# Patient Record
Sex: Female | Born: 1980 | Race: White | Hispanic: No | Marital: Single | Smoking: Never smoker
Health system: Southern US, Community
[De-identification: ages and names within clinical notes are randomized; demographics above are authoritative.]

## PROBLEM LIST (undated history)

## (undated) DIAGNOSIS — G4700 Insomnia, unspecified: Secondary | ICD-10-CM

## (undated) DIAGNOSIS — Z982 Presence of cerebrospinal fluid drainage device: Secondary | ICD-10-CM

## (undated) DIAGNOSIS — R002 Palpitations: Secondary | ICD-10-CM

## (undated) DIAGNOSIS — I10 Essential (primary) hypertension: Secondary | ICD-10-CM

## (undated) DIAGNOSIS — G919 Hydrocephalus, unspecified: Secondary | ICD-10-CM

## (undated) DIAGNOSIS — F329 Major depressive disorder, single episode, unspecified: Secondary | ICD-10-CM

## (undated) DIAGNOSIS — R931 Abnormal findings on diagnostic imaging of heart and coronary circulation: Secondary | ICD-10-CM

## (undated) DIAGNOSIS — G809 Cerebral palsy, unspecified: Secondary | ICD-10-CM

## (undated) DIAGNOSIS — F411 Generalized anxiety disorder: Secondary | ICD-10-CM

## (undated) DIAGNOSIS — N393 Stress incontinence (female) (male): Secondary | ICD-10-CM

## (undated) HISTORY — DX: Abnormal findings on diagnostic imaging of heart and coronary circulation: R93.1

## (undated) HISTORY — DX: Stress incontinence (female) (male): N39.3

## (undated) HISTORY — DX: Generalized anxiety disorder: F41.1

## (undated) HISTORY — DX: Insomnia, unspecified: G47.00

## (undated) HISTORY — DX: Palpitations: R00.2

## (undated) HISTORY — PX: BRAIN SURGERY: SHX531

## (undated) HISTORY — PX: OTHER SURGICAL HISTORY: SHX169

## (undated) HISTORY — PX: REDUCTION MAMMAPLASTY: SUR839

## (undated) HISTORY — DX: Major depressive disorder, single episode, unspecified: F32.9

---
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
## 2000-07-18 ENCOUNTER — Ambulatory Visit (HOSPITAL_COMMUNITY): Admission: RE | Admit: 2000-07-18 | Discharge: 2000-07-18 | Payer: Self-pay | Admitting: Pediatrics

## 2000-07-18 ENCOUNTER — Encounter: Payer: Self-pay | Admitting: Pediatrics

## 2000-08-04 ENCOUNTER — Ambulatory Visit (HOSPITAL_COMMUNITY): Admission: RE | Admit: 2000-08-04 | Discharge: 2000-08-04 | Payer: Self-pay | Admitting: Neurology

## 2001-01-12 ENCOUNTER — Other Ambulatory Visit: Admission: RE | Admit: 2001-01-12 | Discharge: 2001-01-12 | Payer: Self-pay | Admitting: *Deleted

## 2002-09-10 ENCOUNTER — Other Ambulatory Visit: Admission: RE | Admit: 2002-09-10 | Discharge: 2002-09-10 | Payer: Self-pay | Admitting: Obstetrics and Gynecology

## 2003-06-25 ENCOUNTER — Emergency Department (HOSPITAL_COMMUNITY): Admission: EM | Admit: 2003-06-25 | Discharge: 2003-06-25 | Payer: Self-pay

## 2003-09-12 ENCOUNTER — Other Ambulatory Visit: Admission: RE | Admit: 2003-09-12 | Discharge: 2003-09-12 | Payer: Self-pay | Admitting: Obstetrics and Gynecology

## 2004-09-13 ENCOUNTER — Other Ambulatory Visit: Admission: RE | Admit: 2004-09-13 | Discharge: 2004-09-13 | Payer: Self-pay | Admitting: Obstetrics and Gynecology

## 2004-11-14 ENCOUNTER — Emergency Department (HOSPITAL_COMMUNITY): Admission: EM | Admit: 2004-11-14 | Discharge: 2004-11-14 | Payer: Self-pay | Admitting: Emergency Medicine

## 2004-12-06 ENCOUNTER — Encounter: Admission: RE | Admit: 2004-12-06 | Discharge: 2005-01-07 | Payer: Self-pay | Admitting: Orthopedic Surgery

## 2005-09-14 ENCOUNTER — Other Ambulatory Visit: Admission: RE | Admit: 2005-09-14 | Discharge: 2005-09-14 | Payer: Self-pay | Admitting: Obstetrics and Gynecology

## 2006-10-06 ENCOUNTER — Encounter: Admission: RE | Admit: 2006-10-06 | Discharge: 2006-12-07 | Payer: Self-pay | Admitting: Neurology

## 2009-07-20 ENCOUNTER — Encounter: Admission: RE | Admit: 2009-07-20 | Discharge: 2009-10-18 | Payer: Self-pay | Admitting: Neurology

## 2010-06-18 NOTE — Procedures (Signed)
. [facility]  Patient:    Sophia Marks, Sophia Marks                         MRN: [id number] Proc. Date: 08/04/00 Adm. Date:  [id number] Attending:  Fenton Malling                           Procedure Report  DATE OF BIRTH:  1980/02/21  PROCEDURE PERFORMED:  Diagnostic lumbar puncture.  INDICATIONS:  Rule out multiple sclerosis.  PHYSICIAN:  Kelli Hope, M.D.  DESCRIPTION OF PROCEDURE:  The informed consent form was signed and placed on the chart after the procedure risks and benefits were discussed with the patient and her mother and they agreed to proceed.  The patient was placed in the right lateral decubitus position, prepped, and draped in the usual sterile fashion.  Local anesthesia was achieved with 5 cc of lidocaine.  A 20 gauge spinal needle was inserted into the L3-4 interspace and after several passes, clear CSF was obtained.  The opening pressure was measured at 130 mm of water. Approximately 8 cc of CSF were obtained and sent for the following studies. Tube #1 2 cc cell count differential, tube #2 2 cc glucose, protein, oligoclonal bands, immunoelectrophoresis, tube #3 2 cc Grams stain and culture, tube #4 2 cc hold.  Closing pressure was measured at 90 mm of water. The needle was withdrawn and hemostasis was obtained.  No complications were noted.  The patient was advised to remain flat during the procedure for one after the procedure and then will be discharged home.  She was advised to call if she developed worsening symptoms such as fevers, stiff neck or severe postural headache. DD:  08/04/00 TD:  08/04/00 Job: [id number]

## 2011-11-13 ENCOUNTER — Emergency Department (HOSPITAL_COMMUNITY): Payer: Medicaid Other

## 2011-11-13 ENCOUNTER — Encounter (HOSPITAL_COMMUNITY): Payer: Self-pay | Admitting: Emergency Medicine

## 2011-11-13 ENCOUNTER — Emergency Department (HOSPITAL_COMMUNITY)
Admission: EM | Admit: 2011-11-13 | Discharge: 2011-11-13 | Disposition: A | Discharge status: Home / Self Care | Payer: Medicaid Other | Source: Home / Self Care | Attending: Emergency Medicine | Admitting: Emergency Medicine

## 2011-11-13 DIAGNOSIS — R51 Headache: Secondary | ICD-10-CM

## 2011-11-13 DIAGNOSIS — G919 Hydrocephalus, unspecified: Secondary | ICD-10-CM

## 2011-11-13 DIAGNOSIS — Z982 Presence of cerebrospinal fluid drainage device: Secondary | ICD-10-CM | POA: Insufficient documentation

## 2011-11-13 DIAGNOSIS — G809 Cerebral palsy, unspecified: Secondary | ICD-10-CM | POA: Insufficient documentation

## 2011-11-13 DIAGNOSIS — G911 Obstructive hydrocephalus: Secondary | ICD-10-CM | POA: Insufficient documentation

## 2011-11-13 HISTORY — DX: Hydrocephalus, unspecified: G91.9

## 2011-11-13 HISTORY — DX: Presence of cerebrospinal fluid drainage device: Z98.2

## 2011-11-13 HISTORY — DX: Cerebral palsy, unspecified: G80.9

## 2011-11-13 MED ORDER — SODIUM CHLORIDE 0.9 % IV BOLUS (SEPSIS)
1000.0000 mL | Freq: Once | INTRAVENOUS | Status: AC
  Administered 2011-11-13: 1000 mL via INTRAVENOUS

## 2011-11-13 MED ORDER — METOCLOPRAMIDE HCL 5 MG/ML IJ SOLN
10.0000 mg | Freq: Once | INTRAMUSCULAR | Status: AC
  Administered 2011-11-13: 10 mg via INTRAVENOUS
  Filled 2011-11-13: qty 2

## 2011-11-13 MED ORDER — DIPHENHYDRAMINE HCL 50 MG/ML IJ SOLN
25.0000 mg | Freq: Once | INTRAMUSCULAR | Status: AC
  Administered 2011-11-13: 25 mg via INTRAVENOUS
  Filled 2011-11-13: qty 1

## 2011-11-13 NOTE — ED Notes (Signed)
Dr. Venetia Maxon stated pt will go to OR on Wednesday.

## 2011-11-13 NOTE — ED Notes (Signed)
Discharge instructions reviewed w/ pt and mother, both verbalize understanding. To call Dr. Fredrich Birks office in a.m. At [phone] and speak to [facility]. Two prescriptions provided at discharge.

## 2011-11-13 NOTE — ED Notes (Signed)
Dr. Venetia Maxon at bedside to speak with patient and family.

## 2011-11-13 NOTE — Consult Note (Signed)
History    CSN: [id number]  Arrival date & time 11/13/11 1011  First MD Initiated Contact with Patient 11/13/11 1044  Chief Complaint   Patient presents with   .  Headache   .  vp shunt   Patient has shunt-dependent hydrocephalus.  Her last shunt revision was 20 years ago at [facility] and she did well ever since that.  She had 12 total shunt revisions prior to that.  Her previous neurosurgeon is deceased.  Kashay says that she has been having headaches without nausea or vomiting for the last 5 days.  She initially thought these were from stopping coffee, but upon resuming caffeine the headaches have persisted.  A workup in the ER at [facility] today shows ventriculomegaly with two intracranial shunt catheters from a left occipital entry and obvious disruption of shunt tubing at the upper aspect of the distal catheter in the upper neck.  There is the suggestion of trans-ependymal fluid on the Head CT. 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 She has left sided weakness affecting her leg greater than arm as a result of Cerebral Palsy.   Past Medical History   Diagnosis  Date   .  Hydrocephalus    .  VP (ventriculoperitoneal) shunt status    .  Cerebral palsy     Past Surgical History   Procedure  Date   .  Heal surgery     No family history on file.  History   Substance Use Topics   .  Smoking status:  Never Smoker   .  Smokeless tobacco:  Not on file   .  Alcohol Use:       rarely    OB History    Grav  Para  Term  Preterm  Abortions  TAB  SAB  Ect  Mult  Living                  Review of Systems  Allergies   Review of patient's allergies indicates no known allergies.  Home Medications    Current Outpatient Rx   Name  Route  Sig  Dispense  Refill   .  NORGESTREL-ETHINYL ESTRADIOL 0.3-30 MG-MCG PO TABS  Oral  Take 1 tablet by mouth daily.     Marland Kitchen   VALACYCLOVIR HCL 500 MG PO TABS  Oral  Take 500 mg by mouth as needed. Cold sores      BP 135/78  Pulse 88  Temp 98.7 F (37.1 C) (Oral)  Resp 20  SpO2 97%  LMP 11/07/2011  Vitals reviewed  Physical Exam  Physical Examination:Awake, alert, conversant.   PERRL. EOMI. No meningismus or photophobia. Face symmetric.  Tongue mm. No drift.  Mild weakness left hand intrinsics with atrophy.  Weakness in left leg dorsi and plantar flexion.  Great toe upgoing on left with hyperrflexia left greater than right side. Abdomen soft, nontender.  Multiple healed abdominal incisions. ED Course   Labs Reviewed - No data to display  Dg Neck Soft Tissue  11/13/2011 *RADIOLOGY REPORT* Clinical Data: Evaluation of ventriculoperitoneal shunt. NECK SOFT TISSUES - 1+ VIEW Comparison: None. Findings: There is a small discontinuity in the shunt tubing adjacent to the translucent connector in the upper back. This is seen on the frontal view. Translucent shunt tubing connector is present over the skull base and upper cervical spine. Dystrophic calcifications are present along the  course of the shunt tubing. IMPRESSION: Break in the proximal shunt tubing in the upper neck adjacent to the translucent connector. Original Report Authenticated By: Andreas Newport, M.D.  Dg Chest 1 View  11/13/2011 *RADIOLOGY REPORT* Clinical Data: Ventriculoperitoneal shunt. Evaluate shunt. CHEST - 1 VIEW Comparison: 11/14/2004. Findings: The visualized thoracic portions of the shunt appear contiguous and the shunt tubing traverses the left chest. Cardiopericardial silhouette within normal limits. Mediastinal contours normal. Trachea midline. No airspace disease or effusion. IMPRESSION: Intact left sided VP shunt tubing. Original Report Authenticated By: Andreas Newport, M.D.  Dg Abd 1 View  11/13/2011 *RADIOLOGY REPORT* Clinical Data: Headache. Nausea. Hydrocephalus. ABDOMEN - 1 VIEW Comparison: 11/14/2004 abdominal radiographs. Findings: Shunt  tubing terminates in the left upper quadrant. The position is similar to the prior exam of 2006. The prominent stool burden is present. There is a faintly radiopaque coil of presumed old VP shunt tubing in the anatomic pelvis which was also present on the prior exam. IMPRESSION: 1. VP shunt tubing terminates in the left upper quadrant. 2. Old tubing in the anatomic pelvis likely over representing retained shunt fragment. Original Report Authenticated By: Andreas Newport, M.D.  Ct Head Wo Contrast  11/13/2011 *RADIOLOGY REPORT* Clinical Data: Headache. Nausea. History hydrocephalus. CT HEAD WITHOUT CONTRAST Technique: Contiguous axial images were obtained from the base of the skull through the vertex without contrast. Comparison: None. Findings: A left parietal ventriculostomy shunt is in place. The tip of a second ventriculostomy catheter is noted within the left ventricle. The ventricles are moderately dilated with periventricular white matter hypoattenuation raising the specter of transependymal CSF flow. Prominence of the temporal tips suggests there is likely recurrent hydrocephalus. An interventricular cyst is suspected in the right lateral ventricle, potentially related prior scarring. Marked dural calcification may be related to prior hemorrhage or infection. No acute cortical infarct, hemorrhage, mass lesion is present. IMPRESSION: 1. Left parietal ventriculostomy catheter is in satisfactory position. 2. Moderate prominence of the ventricles and suspected transependymal CSF as described is consistent with recurrent hydrocephalus. 3. Suspected and intraventricular cyst on the right. This may be due to adhesions or congenital. 4. The tip of a second ventriculostomy catheter is noted in the left ventricle. 5. Extensive dural calcifications, including calcifications along the tentorium. Original Report Authenticated By: Jamesetta Orleans. MATTERN, M.D.   Assessment and Plan:   Shunt malfunction with  disconnection.  This is likely chronic.  Patient has hydrocephalus and has headache controlled with medication.  I have recommended shunt revision to the patient and have discussed specific plans and recommendations with the patient and her mother.  Rather than revising the previous system, I have recommended placing an entirely new shunt and will do this with Dr. Estelle June assistance, who will perform laparoscopic catheter insertion.  We will plan to do this on 11/16/2011.  I have given the patient prescriptions for Vicodin and also for Tramadol.  They know to go to [facility] ER if headache becomes much worse or she develops nausea and vomiting.  There is risk with removing the previous catheter which has phlanges which may be embedded in scar tissue  And the distal catheter is degraded and fractured, so this would also have to be replaced. Also, we will place a programmable shunt, to be able to better regulate drainage pressures.

## 2011-11-13 NOTE — ED Notes (Signed)
Pt presenting to ed with c/o headache pain with positive nausea no vomiting. Pt states onset x 4 days. Pt states history of hydrocephalus and she has a shunt. Pt is alert and oriented at this time

## 2011-11-13 NOTE — ED Provider Notes (Signed)
History     CSN: [id number]  Arrival date & time 11/13/11  1011   First MD Initiated Contact with Patient 11/13/11 1044      Chief Complaint  Patient presents with  . Headache  . vp shunt    (Consider location/radiation/quality/duration/timing/severity/associated sxs/prior treatment) HPI Pt presents with headache.  She has a hx of CP and VP shunt- last revision was 1991.  She c/o headaches over the past 4-5 days.  HA is bilateral in frontotemporal area.  She states it is worse with standing, improved with lying down.  No vomiting, no fever, no neck pain.  No changes in vision or her baseline neurologic exam.  There are no other associated systemic symptoms, there are no other alleviating or modifying factors.   Past Medical History  Diagnosis Date  . Hydrocephalus   . VP (ventriculoperitoneal) shunt status   . Cerebral palsy     Past Surgical History  Procedure Date  . Heal surgery     No family history on file.  History  Substance Use Topics  . Smoking status: Never Smoker   . Smokeless tobacco: Not on file  . Alcohol Use:      rarely    OB History    Grav Para Term Preterm Abortions TAB SAB Ect Mult Living                  Review of Systems ROS reviewed and all otherwise negative except for mentioned in HPI  Allergies  Review of patient's allergies indicates no known allergies.  Home Medications   Current Outpatient Rx  Name Route Sig Dispense Refill  . NORGESTREL-ETHINYL ESTRADIOL 0.3-30 MG-MCG PO TABS Oral Take 1 tablet by mouth daily.    Marland Kitchen VALACYCLOVIR HCL 500 MG PO TABS Oral Take 500 mg by mouth as needed. Cold sores      BP 136/83  Pulse 88  Temp 97.7 F (36.5 C) (Oral)  Resp 20  SpO2 97%  LMP 11/07/2011 Vitals reviewed Physical Exam Physical Examination: General appearance - alert, well appearing, and in no distress Mental status - alert, oriented to person, place, and time Eyes - pupils equal and reactive, extraocular eye movements  intact Mouth - mucous membranes moist, pharynx normal without lesions Chest - clear to auscultation, no wheezes, rales or rhonchi, symmetric air entry Heart - normal rate, regular rhythm, normal S1, S2, no murmurs, rubs, clicks or gallops Neurological - alert, oriented, normal speech, increased tone in extremities c/w her prior CP- no changes per patient, strength 5/5 in extremities x 4, sensation distally intact Extremities - peripheral pulses normal, no pedal edema, no clubbing or cyanosis Skin - normal coloration and turgor, no rashes  ED Course  Procedures (including critical care time)  1:17 PM  D/w Dr. Venetia Maxon, neurosurgery- he will come to the ED to consult on patient.  She has been updated on these findings.  Her headache is improved after medications.    2:41 PM Dr. Venetia Maxon has seen patient, he is planning to do shunt revision on Wednesday at [facility].  He has given patient prescriptions and instructions for Wednesday.    Labs Reviewed - No data to display Dg Neck Soft Tissue  11/13/2011  *RADIOLOGY REPORT*  Clinical Data: Evaluation of ventriculoperitoneal shunt.  NECK SOFT TISSUES - 1+ VIEW  Comparison: None.  Findings: There is a small discontinuity in the shunt tubing adjacent to the translucent connector in the upper back.  This is seen on the frontal  view.  Translucent shunt tubing connector is present over the skull base and upper cervical spine.  Dystrophic calcifications are present along the course of the shunt tubing.  IMPRESSION: Break in the proximal shunt tubing in the upper neck adjacent to the translucent connector.   Original Report Authenticated By: Andreas Newport, M.D.    Dg Chest 1 View  11/13/2011  *RADIOLOGY REPORT*  Clinical Data: Ventriculoperitoneal shunt.  Evaluate shunt.  CHEST - 1 VIEW  Comparison: 11/14/2004.  Findings: The visualized thoracic portions of the shunt appear contiguous and the shunt tubing traverses the left chest. Cardiopericardial silhouette  within normal limits. Mediastinal contours normal. Trachea midline.  No airspace disease or effusion.  IMPRESSION: Intact left sided VP shunt tubing.   Original Report Authenticated By: Andreas Newport, M.D.    Dg Abd 1 View  11/13/2011  *RADIOLOGY REPORT*  Clinical Data: Headache.  Nausea.  Hydrocephalus.  ABDOMEN - 1 VIEW  Comparison: 11/14/2004 abdominal radiographs.  Findings: Shunt tubing terminates in the left upper quadrant.  The position is similar to the prior exam of 2006.  The prominent stool burden is present.  There is a faintly radiopaque coil of presumed old VP shunt tubing in the anatomic pelvis which was also present on the prior exam.  IMPRESSION: 1.  VP shunt tubing terminates in the left upper quadrant. 2.  Old tubing in the anatomic pelvis likely over representing retained shunt fragment.   Original Report Authenticated By: Andreas Newport, M.D.    Ct Head Wo Contrast  11/13/2011  *RADIOLOGY REPORT*  Clinical Data: Headache.  Nausea.  History hydrocephalus.  CT HEAD WITHOUT CONTRAST  Technique:  Contiguous axial images were obtained from the base of the skull through the vertex without contrast.  Comparison: None.  Findings: A left parietal ventriculostomy shunt is in place.  The tip of a second ventriculostomy catheter is noted within the left ventricle.  The ventricles are moderately dilated with periventricular white matter hypoattenuation raising the specter of transependymal CSF flow.  Prominence of the temporal tips suggests there is likely recurrent hydrocephalus.  An interventricular cyst is suspected in the right lateral ventricle, potentially related prior scarring.  Marked dural calcification may be related to prior hemorrhage or infection.  No acute cortical infarct, hemorrhage, mass lesion is present.  IMPRESSION:  1.  Left parietal ventriculostomy catheter is in satisfactory position. 2.  Moderate prominence of the ventricles and suspected transependymal CSF as described is  consistent with recurrent hydrocephalus. 3.  Suspected and intraventricular cyst on the right.  This may be due to adhesions or congenital. 4.  The tip of a second ventriculostomy catheter is noted in the left ventricle. 5.  Extensive dural calcifications, including calcifications along the tentorium.   Original Report Authenticated By: Jamesetta Orleans. MATTERN, M.D.      1. Headache   2. Hydrocephalus   3. VP (ventriculoperitoneal) shunt status       MDM  Pt with hx of CP with VP shunt for hydrocephalus- this was placed in on [place] scan results d/w Dr. Venetia Maxon- he has seen patient in ED and is arranging for revision on Wednesday 10/16.  Discharged with strict return precautions.  Pt agreeable with plan.        Ethelda Chick, MD 11/14/11 [phone]

## 2011-11-14 ENCOUNTER — Inpatient Hospital Stay (HOSPITAL_COMMUNITY)
Admission: EM | Admit: 2011-11-14 | Discharge: 2011-11-18 | DRG: 264 | Disposition: A | Discharge status: Home / Self Care | Payer: Medicaid Other | Attending: Neurosurgery | Admitting: Neurosurgery

## 2011-11-14 ENCOUNTER — Encounter (HOSPITAL_COMMUNITY): Payer: Self-pay | Admitting: *Deleted

## 2011-11-14 DIAGNOSIS — T82598A Other mechanical complication of other cardiac and vascular devices and implants, initial encounter: Principal | ICD-10-CM | POA: Diagnosis present

## 2011-11-14 DIAGNOSIS — Z79899 Other long term (current) drug therapy: Secondary | ICD-10-CM

## 2011-11-14 DIAGNOSIS — G809 Cerebral palsy, unspecified: Secondary | ICD-10-CM | POA: Diagnosis present

## 2011-11-14 DIAGNOSIS — R110 Nausea: Secondary | ICD-10-CM | POA: Diagnosis not present

## 2011-11-14 DIAGNOSIS — Z9049 Acquired absence of other specified parts of digestive tract: Secondary | ICD-10-CM

## 2011-11-14 DIAGNOSIS — T85618A Breakdown (mechanical) of other specified internal prosthetic devices, implants and grafts, initial encounter: Secondary | ICD-10-CM | POA: Diagnosis present

## 2011-11-14 DIAGNOSIS — R51 Headache: Secondary | ICD-10-CM | POA: Diagnosis present

## 2011-11-14 DIAGNOSIS — Y838 Other surgical procedures as the cause of abnormal reaction of the patient, or of later complication, without mention of misadventure at the time of the procedure: Secondary | ICD-10-CM | POA: Diagnosis present

## 2011-11-14 DIAGNOSIS — G911 Obstructive hydrocephalus: Secondary | ICD-10-CM | POA: Diagnosis present

## 2011-11-14 DIAGNOSIS — Y92009 Unspecified place in unspecified non-institutional (private) residence as the place of occurrence of the external cause: Secondary | ICD-10-CM

## 2011-11-14 DIAGNOSIS — G919 Hydrocephalus, unspecified: Secondary | ICD-10-CM | POA: Diagnosis present

## 2011-11-14 LAB — CBC WITH DIFFERENTIAL/PLATELET
Basophils Absolute: 0 10*3/uL (ref 0.0–0.1)
Basophils Relative: 0 % (ref 0–1)
Eosinophils Absolute: 0 10*3/uL (ref 0.0–0.7)
Eosinophils Relative: 0 % (ref 0–5)
HCT: 42 % (ref 36.0–46.0)
Hemoglobin: 14.6 g/dL (ref 12.0–15.0)
Lymphocytes Relative: 13 % (ref 12–46)
Lymphs Abs: 1.3 10*3/uL (ref 0.7–4.0)
MCH: 31.6 pg (ref 26.0–34.0)
MCHC: 34.8 g/dL (ref 30.0–36.0)
MCV: 90.9 fL (ref 78.0–100.0)
Monocytes Absolute: 0.3 10*3/uL (ref 0.1–1.0)
Monocytes Relative: 3 % (ref 3–12)
Neutro Abs: 7.9 10*3/uL — ABNORMAL HIGH (ref 1.7–7.7)
Neutrophils Relative %: 84 % — ABNORMAL HIGH (ref 43–77)
Platelets: 255 10*3/uL (ref 150–400)
RBC: 4.62 MIL/uL (ref 3.87–5.11)
RDW: 12.2 % (ref 11.5–15.5)
WBC: 9.5 10*3/uL (ref 4.0–10.5)

## 2011-11-14 LAB — BASIC METABOLIC PANEL
BUN: 13 mg/dL (ref 6–23)
CO2: 24 mEq/L (ref 19–32)
Calcium: 9.4 mg/dL (ref 8.4–10.5)
Chloride: 101 mEq/L (ref 96–112)
Creatinine, Ser: 0.49 mg/dL — ABNORMAL LOW (ref 0.50–1.10)
GFR calc Af Amer: >90 mL/min (ref >90)
GFR calc non Af Amer: >90 mL/min (ref >90)
Glucose, Bld: 104 mg/dL — ABNORMAL HIGH (ref 70–99)
Potassium: 3.9 mEq/L (ref 3.5–5.1)
Sodium: 137 mEq/L (ref 135–145)

## 2011-11-14 MED ORDER — ACETAMINOPHEN 650 MG RE SUPP: 650.0000 mg | Freq: Four times a day (QID) | RECTAL | Status: DC | PRN

## 2011-11-14 MED ORDER — OXYCODONE HCL 5 MG PO TABS
5.0000 mg | ORAL_TABLET | ORAL | Status: DC | PRN
  Filled 2011-11-14: qty 1

## 2011-11-14 MED ORDER — ONDANSETRON HCL 4 MG/2ML IJ SOLN
4.0000 mg | Freq: Once | INTRAMUSCULAR | Status: AC
  Administered 2011-11-14: 4 mg via INTRAVENOUS
  Filled 2011-11-14: qty 2

## 2011-11-14 MED ORDER — HYDROMORPHONE HCL PF 1 MG/ML IJ SOLN
0.5000 mg | INTRAMUSCULAR | Status: DC | PRN
  Administered 2011-11-14 – 2011-11-16 (×16): 0.5 mg via INTRAVENOUS
  Filled 2011-11-14 (×16): qty 1

## 2011-11-14 MED ORDER — VALACYCLOVIR HCL 500 MG PO TABS
500.0000 mg | ORAL_TABLET | ORAL | Status: DC | PRN
  Filled 2011-11-14: qty 1

## 2011-11-14 MED ORDER — NORGESTREL-ETHINYL ESTRADIOL 0.3-30 MG-MCG PO TABS
1.0000 | ORAL_TABLET | Freq: Every day | ORAL | Status: DC
  Administered 2011-11-15 – 2011-11-18 (×3): 1 via ORAL

## 2011-11-14 MED ORDER — MORPHINE SULFATE 4 MG/ML IJ SOLN
4.0000 mg | Freq: Once | INTRAMUSCULAR | Status: AC
  Administered 2011-11-14: 4 mg via INTRAVENOUS
  Filled 2011-11-14: qty 1

## 2011-11-14 MED ORDER — ACETAMINOPHEN 325 MG PO TABS
650.0000 mg | ORAL_TABLET | Freq: Four times a day (QID) | ORAL | Status: DC | PRN
  Filled 2011-11-14: qty 2

## 2011-11-14 MED ORDER — SODIUM CHLORIDE 0.9 % IV SOLN
INTRAVENOUS | Status: DC
  Administered 2011-11-14: 125 mL/h via INTRAVENOUS
  Administered 2011-11-15 – 2011-11-16 (×2): via INTRAVENOUS

## 2011-11-14 NOTE — H&P (Signed)
Sophia Marks is an 31 y.o. female.   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 Workup has demonstrated evidence of a VP shunt disconnection with partial malfunction. Patient is preop for VP shunt revision on Wednesday with Dr. Venetia Maxon. She presents to the emergency room today with worsening headaches. She's had no change in her level of consciousness. She's had no other neurologic complaints. Her headaches are much improved with a small amount of morphine in the emergency department. Plan is to admit her for pain control and observation.  Past Medical History  Diagnosis Date  . Hydrocephalus   . VP (ventriculoperitoneal) shunt status   . Cerebral palsy     Past Surgical History  Procedure Date  . Heal surgery     No family history on file. Social History:  reports that she has never smoked. She does not have any smokeless tobacco history on file. She reports that she does not use illicit drugs. Her alcohol history not on file.  Allergies: No Known Allergies   (Not in a hospital admission)  Results for orders placed during the hospital encounter of 11/14/11 (from the past 48 hour(s))  CBC WITH DIFFERENTIAL     Status: Abnormal   Collection Time   11/14/11  5:21 PM      Component Value Range Comment   WBC 9.5  4.0 - 10.5 K/uL    RBC 4.62  3.87 - 5.11 MIL/uL    Hemoglobin 14.6  12.0 - 15.0 g/dL    HCT 19.1  47.8 - 29.5 %    MCV 90.9  78.0 - 100.0 fL    MCH 31.6  26.0 - 34.0 pg    MCHC 34.8  30.0 - 36.0 g/dL    RDW 62.1  30.8 - 65.7 %    Platelets 255  150 - 400 K/uL    Neutrophils Relative 84 (*) 43 - 77 %    Neutro Abs 7.9 (*) 1.7 - 7.7 K/uL    Lymphocytes Relative 13  12 - 46 %    Lymphs Abs 1.3  0.7 - 4.0 K/uL    Monocytes Relative 3  3 - 12 %    Monocytes Absolute 0.3  0.1 - 1.0 K/uL    Eosinophils Relative 0  0 - 5 %    Eosinophils Absolute 0.0  0.0 -  0.7 K/uL    Basophils Relative 0  0 - 1 %    Basophils Absolute 0.0  0.0 - 0.1 K/uL   BASIC METABOLIC PANEL     Status: Abnormal   Collection Time   11/14/11  5:21 PM      Component Value Range Comment   Sodium 137  135 - 145 mEq/L    Potassium 3.9  3.5 - 5.1 mEq/L    Chloride 101  96 - 112 mEq/L    CO2 24  19 - 32 mEq/L    Glucose, Bld 104 (*) 70 - 99 mg/dL    BUN 13  6 - 23 mg/dL    Creatinine, Ser 8.46 (*) 0.50 - 1.10 mg/dL    Calcium 9.4  8.4 - 96.2 mg/dL    GFR calc non Af Amer >90  >90 mL/min    GFR calc Af Amer >90  >90 mL/min    Dg Neck Soft Tissue  11/13/2011  *RADIOLOGY REPORT*  Clinical Data: Evaluation of ventriculoperitoneal shunt.  NECK SOFT TISSUES - 1+ VIEW  Comparison: None.  Findings: There is a small discontinuity in the shunt tubing adjacent to the translucent connector in the upper back.  This is seen on the frontal view.  Translucent shunt tubing connector is present over the skull base and upper cervical spine.  Dystrophic calcifications are present along the course of the shunt tubing.  IMPRESSION: Break in the proximal shunt tubing in the upper neck adjacent to the translucent connector.   Original Report Authenticated By: Andreas Newport, M.D.    Dg Chest 1 View  11/13/2011  *RADIOLOGY REPORT*  Clinical Data: Ventriculoperitoneal shunt.  Evaluate shunt.  CHEST - 1 VIEW  Comparison: 11/14/2004.  Findings: The visualized thoracic portions of the shunt appear contiguous and the shunt tubing traverses the left chest. Cardiopericardial silhouette within normal limits. Mediastinal contours normal. Trachea midline.  No airspace disease or effusion.  IMPRESSION: Intact left sided VP shunt tubing.   Original Report Authenticated By: Andreas Newport, M.D.    Dg Abd 1 View  11/13/2011  *RADIOLOGY REPORT*  Clinical Data: Headache.  Nausea.  Hydrocephalus.  ABDOMEN - 1 VIEW  Comparison: 11/14/2004 abdominal radiographs.  Findings: Shunt tubing terminates in the left upper  quadrant.  The position is similar to the prior exam of 2006.  The prominent stool burden is present.  There is a faintly radiopaque coil of presumed old VP shunt tubing in the anatomic pelvis which was also present on the prior exam.  IMPRESSION: 1.  VP shunt tubing terminates in the left upper quadrant. 2.  Old tubing in the anatomic pelvis likely over representing retained shunt fragment.   Original Report Authenticated By: Andreas Newport, M.D.    Ct Head Wo Contrast  11/13/2011  *RADIOLOGY REPORT*  Clinical Data: Headache.  Nausea.  History hydrocephalus.  CT HEAD WITHOUT CONTRAST  Technique:  Contiguous axial images were obtained from the base of the skull through the vertex without contrast.  Comparison: None.  Findings: A left parietal ventriculostomy shunt is in place.  The tip of a second ventriculostomy catheter is noted within the left ventricle.  The ventricles are moderately dilated with periventricular white matter hypoattenuation raising the specter of transependymal CSF flow.  Prominence of the temporal tips suggests there is likely recurrent hydrocephalus.  An interventricular cyst is suspected in the right lateral ventricle, potentially related prior scarring.  Marked dural calcification may be related to prior hemorrhage or infection.  No acute cortical infarct, hemorrhage, mass lesion is present.  IMPRESSION:  1.  Left parietal ventriculostomy catheter is in satisfactory position. 2.  Moderate prominence of the ventricles and suspected transependymal CSF as described is consistent with recurrent hydrocephalus. 3.  Suspected and intraventricular cyst on the right.  This may be due to adhesions or congenital. 4.  The tip of a second ventriculostomy catheter is noted in the left ventricle. 5.  Extensive dural calcifications, including calcifications along the tentorium.   Original Report Authenticated By: Jamesetta Orleans. MATTERN, M.D.     Review of Systems  Constitutional: Negative.   HENT:  Negative.   Eyes: Negative.   Respiratory: Negative.   Cardiovascular: Negative.   Gastrointestinal: Negative.   Genitourinary: Negative.   Musculoskeletal: Negative.   Skin: Negative.   Neurological: Negative.   Endo/Heme/Allergies: Negative.   Psychiatric/Behavioral: Negative.     Blood pressure 156/88, pulse 58, temperature 97.7 F (36.5 C), temperature source Oral, resp. rate 18, last menstrual period 11/07/2011, SpO2 97.00%. Physical Exam  Constitutional: She is oriented to person, place, and time. She  appears well-developed and well-nourished. No distress.  HENT:  Head: Normocephalic and atraumatic.  Right Ear: External ear normal.  Left Ear: External ear normal.  Nose: Nose normal.  Mouth/Throat: Oropharynx is clear and moist.  Eyes: Conjunctivae normal and EOM are normal. Pupils are equal, round, and reactive to light. Right eye exhibits no discharge. Left eye exhibits no discharge.  Neck: Normal range of motion. Neck supple. No JVD present. No tracheal deviation present. No thyromegaly present.  Cardiovascular: Normal rate, regular rhythm, normal heart sounds and intact distal pulses.  Exam reveals no friction rub.   No murmur heard. Respiratory: Effort normal and breath sounds normal. No respiratory distress. She has no wheezes.  GI: Soft. Bowel sounds are normal. She exhibits no distension. There is no tenderness.  Musculoskeletal: Normal range of motion. She exhibits no edema and no tenderness.  Neurological: She is alert and oriented to person, place, and time. She displays abnormal reflex. No cranial nerve deficit. She exhibits abnormal muscle tone. Coordination normal.  Skin: Skin is warm and dry. No rash noted. She is not diaphoretic. No erythema. No pallor.  Psychiatric: She has a normal mood and affect. Her behavior is normal. Judgment and thought content normal.     Assessment/Plan VP shunt malfunction secondary to catheter disconnection. Plan to admit for pain  control and VP shunt revision on Wednesday. Should her symptoms worsen before then we may consider more urgent VP shunt revision.  Kandra Graven A 11/14/2011, 6:28 PM

## 2011-11-14 NOTE — ED Notes (Signed)
Pt Has been having a bad headache since Wednesday and with pain medications it is not improving.  PT is scheduled for a shunt revision on Wednesday.  Told to come here if symptoms not getting better with pain meds

## 2011-11-14 NOTE — ED Notes (Signed)
Pt. Received dinner tray. Given diet Coke.  Mother with pt.

## 2011-11-14 NOTE — ED Notes (Signed)
Called report to nurse [address] bed 6.

## 2011-11-14 NOTE — ED Notes (Signed)
Pt. Ate 100% of dinner. Pastor at the bedside.

## 2011-11-15 ENCOUNTER — Other Ambulatory Visit (INDEPENDENT_AMBULATORY_CARE_PROVIDER_SITE_OTHER): Payer: Self-pay | Admitting: General Surgery

## 2011-11-15 LAB — URINE MICROSCOPIC-ADD ON

## 2011-11-15 LAB — URINALYSIS, ROUTINE W REFLEX MICROSCOPIC
Bilirubin Urine: NEGATIVE
Glucose, UA: NEGATIVE mg/dL
Hgb urine dipstick: NEGATIVE
Ketones, ur: NEGATIVE mg/dL
Nitrite: NEGATIVE
Protein, ur: NEGATIVE mg/dL
Specific Gravity, Urine: 1.022 (ref 1.005–1.030)
Urobilinogen, UA: 0.2 mg/dL (ref 0.0–1.0)
pH: 6 (ref 5.0–8.0)

## 2011-11-15 LAB — PREGNANCY, URINE: Preg Test, Ur: NEGATIVE

## 2011-11-15 MED ORDER — ONDANSETRON HCL 4 MG/2ML IJ SOLN
4.0000 mg | Freq: Four times a day (QID) | INTRAMUSCULAR | Status: DC | PRN
  Administered 2011-11-15 – 2011-11-16 (×3): 4 mg via INTRAVENOUS
  Filled 2011-11-15 (×3): qty 2

## 2011-11-15 NOTE — Progress Notes (Signed)
Subjective: Patient reports "I have a little nausea, but not bad. The medicine helps my headache."  Objective: Vital signs in last 24 hours: Temp:  [97.3 F (36.3 C)-99 F (37.2 C)] 98.2 F (36.8 C) (10/15 0601) Pulse Rate:  [54-73] 61  (10/15 0601) Resp:  [16-18] 16  (10/15 0601) BP: (125-156)/(63-88) 128/63 mmHg (10/15 0601) SpO2:  [95 %-100 %] 97 % (10/15 0601) Weight:  [54.976 kg (121 lb 3.2 oz)] 54.976 kg (121 lb 3.2 oz) (10/14 2138)  Intake/Output from previous day: 10/14 0701 - 10/15 0700 In: 120 [P.O.:120] Out: -  Intake/Output this shift:    Alert, conversant. Reports mild nausea, but good appetite. H/a persists, but controlled by pain meds.   Lab Results:  [facility] 11/14/11 1721  WBC 9.5  HGB 14.6  HCT 42.0  PLT 255   BMET  Basename 11/14/11 1721  NA 137  K 3.9  CL 101  CO2 24  GLUCOSE 104*  BUN 13  CREATININE 0.49*  CALCIUM 9.4    Studies/Results: Dg Neck Soft Tissue  11/13/2011  *RADIOLOGY REPORT*  Clinical Data: Evaluation of ventriculoperitoneal shunt.  NECK SOFT TISSUES - 1+ VIEW  Comparison: None.  Findings: There is a small discontinuity in the shunt tubing adjacent to the translucent connector in the upper back.  This is seen on the frontal view.  Translucent shunt tubing connector is present over the skull base and upper cervical spine.  Dystrophic calcifications are present along the course of the shunt tubing.  IMPRESSION: Break in the proximal shunt tubing in the upper neck adjacent to the translucent connector.   Original Report Authenticated By: Andreas Newport, M.D.    Dg Chest 1 View  11/13/2011  *RADIOLOGY REPORT*  Clinical Data: Ventriculoperitoneal shunt.  Evaluate shunt.  CHEST - 1 VIEW  Comparison: 11/14/2004.  Findings: The visualized thoracic portions of the shunt appear contiguous and the shunt tubing traverses the left chest. Cardiopericardial silhouette within normal limits. Mediastinal contours normal. Trachea midline.  No  airspace disease or effusion.  IMPRESSION: Intact left sided VP shunt tubing.   Original Report Authenticated By: Andreas Newport, M.D.    Dg Abd 1 View  11/13/2011  *RADIOLOGY REPORT*  Clinical Data: Headache.  Nausea.  Hydrocephalus.  ABDOMEN - 1 VIEW  Comparison: 11/14/2004 abdominal radiographs.  Findings: Shunt tubing terminates in the left upper quadrant.  The position is similar to the prior exam of 2006.  The prominent stool burden is present.  There is a faintly radiopaque coil of presumed old VP shunt tubing in the anatomic pelvis which was also present on the prior exam.  IMPRESSION: 1.  VP shunt tubing terminates in the left upper quadrant. 2.  Old tubing in the anatomic pelvis likely over representing retained shunt fragment.   Original Report Authenticated By: Andreas Newport, M.D.    Ct Head Wo Contrast  11/13/2011  *RADIOLOGY REPORT*  Clinical Data: Headache.  Nausea.  History hydrocephalus.  CT HEAD WITHOUT CONTRAST  Technique:  Contiguous axial images were obtained from the base of the skull through the vertex without contrast.  Comparison: None.  Findings: A left parietal ventriculostomy shunt is in place.  The tip of a second ventriculostomy catheter is noted within the left ventricle.  The ventricles are moderately dilated with periventricular white matter hypoattenuation raising the specter of transependymal CSF flow.  Prominence of the temporal tips suggests there is likely recurrent hydrocephalus.  An interventricular cyst is suspected in the right lateral ventricle, potentially related prior  scarring.  Marked dural calcification may be related to prior hemorrhage or infection.  No acute cortical infarct, hemorrhage, mass lesion is present.  IMPRESSION:  1.  Left parietal ventriculostomy catheter is in satisfactory position. 2.  Moderate prominence of the ventricles and suspected transependymal CSF as described is consistent with recurrent hydrocephalus. 3.  Suspected and  intraventricular cyst on the right.  This may be due to adhesions or congenital. 4.  The tip of a second ventriculostomy catheter is noted in the left ventricle. 5.  Extensive dural calcifications, including calcifications along the tentorium.   Original Report Authenticated By: Jamesetta Orleans. MATTERN, M.D.     Assessment/Plan: Stable, awaiting v.p. shunt placement.  LOS: 1 day  If increased nausea, notify MD; will move surgery up to today; otherwise will proceed as scheduled tomorrow.   Georgiann Cocker 11/15/2011, 7:47 AM

## 2011-11-15 NOTE — Progress Notes (Signed)
As above.

## 2011-11-15 NOTE — Progress Notes (Signed)
Patient ID: Sophia Marks, female   DOB: 1980/07/01, 31 y.o.   MRN: [id number]  Visited at noon, reported mild nausea without change, some headache, controlled by IV Dilaudid. Appetite remains good. Alert, conversant.  At present, nausea has subsided. Headache remains mild and responsive to meds. Alert, conversant.   Georgiann Cocker, RN, BSN

## 2011-11-15 NOTE — ED Provider Notes (Signed)
History     CSN: [id number]  Arrival date & time 11/14/11  1441   First MD Initiated Contact with Patient 11/14/11 1704      Chief Complaint  Patient presents with  . Shunt revision/headache     (Consider location/radiation/quality/duration/timing/severity/associated sxs/prior treatment) HPI Comments: Sophia Marks is a 31 y.o. Female here for evaluation of headache. Headache. Is worsening. She is taking her Percocet sporadically. She was seen yesterday and set up for a replacement of her VP shunt in 2 days. She does not feel she can wait for that. She does not have any vomiting. She is able to walk. She has chronic gait disability from her cerebral palsy. She denies fever, or chills. There are no modifying factors  The history is provided by the patient and a relative.    Past Medical History  Diagnosis Date  . Hydrocephalus   . VP (ventriculoperitoneal) shunt status   . Cerebral palsy     Past Surgical History  Procedure Date  . Heal surgery     History reviewed. No pertinent family history.  History  Substance Use Topics  . Smoking status: Never Smoker   . Smokeless tobacco: Not on file  . Alcohol Use:      rarely    OB History    Grav Para Term Preterm Abortions TAB SAB Ect Mult Living                  Review of Systems  All other systems reviewed and are negative.    Allergies  Review of patient's allergies indicates no known allergies.  Home Medications  No current outpatient prescriptions on file.  BP 125/65  Pulse 73  Temp 99 F (37.2 C) (Oral)  Resp 16  Ht 5' (1.524 m)  Wt 121 lb 3.2 oz (54.976 kg)  BMI 23.67 kg/m2  SpO2 97%  LMP 11/07/2011  Physical Exam  Nursing note and vitals reviewed. Constitutional: She is oriented to person, place, and time. She appears well-developed and well-nourished.  HENT:  Head: Normocephalic and atraumatic.  Eyes: Conjunctivae normal and EOM are normal. Pupils are equal, round, and reactive to light.    Neck: Normal range of motion and phonation normal. Neck supple.  Cardiovascular: Normal rate, regular rhythm and intact distal pulses.   Pulmonary/Chest: Effort normal and breath sounds normal. She exhibits no tenderness.  Abdominal: Soft. She exhibits no distension. There is no tenderness. There is no guarding.  Musculoskeletal: Normal range of motion.  Neurological: She is alert and oriented to person, place, and time. She has normal strength. She exhibits normal muscle tone.       Mild ataxia, left greater than right  Skin: Skin is warm and dry.  Psychiatric: She has a normal mood and affect. Her behavior is normal. Judgment and thought content normal.    ED Course  Procedures (including critical care time)  Labs Reviewed  CBC WITH DIFFERENTIAL - Abnormal; Notable for the following:    Neutrophils Relative 84 (*)     Neutro Abs 7.9 (*)     All other components within normal limits  BASIC METABOLIC PANEL - Abnormal; Notable for the following:    Glucose, Bld 104 (*)     Creatinine, Ser 0.49 (*)     All other components within normal limits  URINALYSIS, ROUTINE W REFLEX MICROSCOPIC  PREGNANCY, URINE   Dg Neck Soft Tissue  11/13/2011  *RADIOLOGY REPORT*  Clinical Data: Evaluation of ventriculoperitoneal shunt.  NECK SOFT  TISSUES - 1+ VIEW  Comparison: None.  Findings: There is a small discontinuity in the shunt tubing adjacent to the translucent connector in the upper back.  This is seen on the frontal view.  Translucent shunt tubing connector is present over the skull base and upper cervical spine.  Dystrophic calcifications are present along the course of the shunt tubing.  IMPRESSION: Break in the proximal shunt tubing in the upper neck adjacent to the translucent connector.   Original Report Authenticated By: Andreas Newport, M.D.    Dg Chest 1 View  11/13/2011  *RADIOLOGY REPORT*  Clinical Data: Ventriculoperitoneal shunt.  Evaluate shunt.  CHEST - 1 VIEW  Comparison:  11/14/2004.  Findings: The visualized thoracic portions of the shunt appear contiguous and the shunt tubing traverses the left chest. Cardiopericardial silhouette within normal limits. Mediastinal contours normal. Trachea midline.  No airspace disease or effusion.  IMPRESSION: Intact left sided VP shunt tubing.   Original Report Authenticated By: Andreas Newport, M.D.    Dg Abd 1 View  11/13/2011  *RADIOLOGY REPORT*  Clinical Data: Headache.  Nausea.  Hydrocephalus.  ABDOMEN - 1 VIEW  Comparison: 11/14/2004 abdominal radiographs.  Findings: Shunt tubing terminates in the left upper quadrant.  The position is similar to the prior exam of 2006.  The prominent stool burden is present.  There is a faintly radiopaque coil of presumed old VP shunt tubing in the anatomic pelvis which was also present on the prior exam.  IMPRESSION: 1.  VP shunt tubing terminates in the left upper quadrant. 2.  Old tubing in the anatomic pelvis likely over representing retained shunt fragment.   Original Report Authenticated By: Andreas Newport, M.D.    Ct Head Wo Contrast  11/13/2011  *RADIOLOGY REPORT*  Clinical Data: Headache.  Nausea.  History hydrocephalus.  CT HEAD WITHOUT CONTRAST  Technique:  Contiguous axial images were obtained from the base of the skull through the vertex without contrast.  Comparison: None.  Findings: A left parietal ventriculostomy shunt is in place.  The tip of a second ventriculostomy catheter is noted within the left ventricle.  The ventricles are moderately dilated with periventricular white matter hypoattenuation raising the specter of transependymal CSF flow.  Prominence of the temporal tips suggests there is likely recurrent hydrocephalus.  An interventricular cyst is suspected in the right lateral ventricle, potentially related prior scarring.  Marked dural calcification may be related to prior hemorrhage or infection.  No acute cortical infarct, hemorrhage, mass lesion is present.  IMPRESSION:   1.  Left parietal ventriculostomy catheter is in satisfactory position. 2.  Moderate prominence of the ventricles and suspected transependymal CSF as described is consistent with recurrent hydrocephalus. 3.  Suspected and intraventricular cyst on the right.  This may be due to adhesions or congenital. 4.  The tip of a second ventriculostomy catheter is noted in the left ventricle. 5.  Extensive dural calcifications, including calcifications along the tentorium.   Original Report Authenticated By: Jamesetta Orleans. MATTERN, M.D.      1. Headache       MDM  Headache, secondary to ventricular shunt mal-function. Patient evaluated by neurosurgery, and they will admit the pattient       Flint Melter, MD 11/15/11 [phone]

## 2011-11-16 ENCOUNTER — Ambulatory Visit (HOSPITAL_COMMUNITY): Admission: RE | Admit: 2011-11-16 | Payer: Medicaid Other | Source: Ambulatory Visit | Admitting: Neurosurgery

## 2011-11-16 ENCOUNTER — Encounter (HOSPITAL_COMMUNITY): Payer: Self-pay | Admitting: Anesthesiology

## 2011-11-16 ENCOUNTER — Inpatient Hospital Stay (HOSPITAL_COMMUNITY): Payer: Medicaid Other | Admitting: Anesthesiology

## 2011-11-16 ENCOUNTER — Encounter (HOSPITAL_COMMUNITY)
Admission: EM | Disposition: A | Discharge status: Home / Self Care | Payer: Self-pay | Source: Home / Self Care | Attending: Neurosurgery

## 2011-11-16 DIAGNOSIS — R51 Headache: Secondary | ICD-10-CM

## 2011-11-16 DIAGNOSIS — T85695A Other mechanical complication of other nervous system device, implant or graft, initial encounter: Secondary | ICD-10-CM

## 2011-11-16 DIAGNOSIS — G911 Obstructive hydrocephalus: Secondary | ICD-10-CM

## 2011-11-16 HISTORY — PX: VENTRICULOPERITONEAL SHUNT: SHX204

## 2011-11-16 LAB — SURGICAL PCR SCREEN
MRSA, PCR: NEGATIVE
Staphylococcus aureus: NEGATIVE

## 2011-11-16 SURGERY — SHUNT INSERTION VENTRICULAR-PERITONEAL
Anesthesia: General | Site: Head | Laterality: Right | Wound class: Clean

## 2011-11-16 MED ORDER — PROPOFOL 10 MG/ML IV BOLUS
INTRAVENOUS | Status: DC | PRN
  Administered 2011-11-16: 100 mg via INTRAVENOUS

## 2011-11-16 MED ORDER — CEFAZOLIN SODIUM 1-5 GM-% IV SOLN
INTRAVENOUS | Status: DC | PRN
  Administered 2011-11-16: 2 g via INTRAVENOUS

## 2011-11-16 MED ORDER — LIDOCAINE-EPINEPHRINE 1 %-1:100000 IJ SOLN
INTRAMUSCULAR | Status: DC | PRN
  Administered 2011-11-16: 3.5 mL

## 2011-11-16 MED ORDER — DEXAMETHASONE SODIUM PHOSPHATE 4 MG/ML IJ SOLN
INTRAMUSCULAR | Status: DC | PRN
  Administered 2011-11-16: 4 mg via INTRAVENOUS

## 2011-11-16 MED ORDER — THROMBIN 20000 UNITS EX KIT
PACK | CUTANEOUS | Status: DC | PRN
  Administered 2011-11-16: 20000 [IU] via TOPICAL

## 2011-11-16 MED ORDER — ACETAMINOPHEN 325 MG PO TABS
650.0000 mg | ORAL_TABLET | ORAL | Status: DC | PRN
  Administered 2011-11-17 – 2011-11-18 (×4): 650 mg via ORAL
  Filled 2011-11-16 (×4): qty 2

## 2011-11-16 MED ORDER — SENNA 8.6 MG PO TABS
1.0000 | ORAL_TABLET | Freq: Two times a day (BID) | ORAL | Status: DC
  Administered 2011-11-17 – 2011-11-18 (×3): 8.6 mg via ORAL
  Filled 2011-11-16 (×6): qty 1

## 2011-11-16 MED ORDER — POLYETHYLENE GLYCOL 3350 17 G PO PACK
17.0000 g | PACK | Freq: Every day | ORAL | Status: DC | PRN
  Filled 2011-11-16: qty 1

## 2011-11-16 MED ORDER — ONDANSETRON HCL 4 MG/2ML IJ SOLN: 4.0000 mg | INTRAMUSCULAR | Status: DC | PRN

## 2011-11-16 MED ORDER — HYDROMORPHONE HCL PF 1 MG/ML IJ SOLN: 0.5000 mg | INTRAMUSCULAR | Status: DC | PRN

## 2011-11-16 MED ORDER — LIDOCAINE HCL (CARDIAC) 20 MG/ML IV SOLN
INTRAVENOUS | Status: DC | PRN
  Administered 2011-11-16: 100 mg via INTRAVENOUS

## 2011-11-16 MED ORDER — ONDANSETRON HCL 4 MG PO TABS: 4.0000 mg | ORAL_TABLET | ORAL | Status: DC | PRN

## 2011-11-16 MED ORDER — HEMOSTATIC AGENTS (NO CHARGE) OPTIME
TOPICAL | Status: DC | PRN
  Administered 2011-11-16: 1 via TOPICAL

## 2011-11-16 MED ORDER — LACTATED RINGERS IV SOLN
INTRAVENOUS | Status: DC | PRN
  Administered 2011-11-16: 16:00:00 via INTRAVENOUS

## 2011-11-16 MED ORDER — PANTOPRAZOLE SODIUM 40 MG IV SOLR
40.0000 mg | Freq: Every day | INTRAVENOUS | Status: DC
  Administered 2011-11-16 – 2011-11-18 (×2): 40 mg via INTRAVENOUS
  Filled 2011-11-16 (×4): qty 40

## 2011-11-16 MED ORDER — HYDROMORPHONE HCL PF 1 MG/ML IJ SOLN: 0.2500 mg | INTRAMUSCULAR | Status: DC | PRN

## 2011-11-16 MED ORDER — ONDANSETRON HCL 4 MG/2ML IJ SOLN
INTRAMUSCULAR | Status: DC | PRN
  Administered 2011-11-16: 4 mg via INTRAVENOUS

## 2011-11-16 MED ORDER — CEFAZOLIN SODIUM 1-5 GM-% IV SOLN
1.0000 g | Freq: Three times a day (TID) | INTRAVENOUS | Status: AC
  Administered 2011-11-16 – 2011-11-17 (×2): 1 g via INTRAVENOUS
  Filled 2011-11-16 (×2): qty 50

## 2011-11-16 MED ORDER — ONDANSETRON HCL 4 MG/2ML IJ SOLN: 4.0000 mg | Freq: Once | INTRAMUSCULAR | Status: DC | PRN

## 2011-11-16 MED ORDER — DOCUSATE SODIUM 100 MG PO CAPS
100.0000 mg | ORAL_CAPSULE | Freq: Two times a day (BID) | ORAL | Status: DC
Start: 2011-11-16 — End: 2011-11-18
  Administered 2011-11-17 – 2011-11-18 (×3): 100 mg via ORAL
  Filled 2011-11-16 (×6): qty 1

## 2011-11-16 MED ORDER — SODIUM CHLORIDE 0.9 % IR SOLN
Status: DC | PRN
  Administered 2011-11-16: 1000 mL

## 2011-11-16 MED ORDER — OXYCODONE HCL 5 MG PO TABS: 5.0000 mg | ORAL_TABLET | Freq: Once | ORAL | Status: DC | PRN

## 2011-11-16 MED ORDER — BISACODYL 10 MG RE SUPP: 10.0000 mg | Freq: Every day | RECTAL | Status: DC | PRN

## 2011-11-16 MED ORDER — BACITRACIN ZINC 500 UNIT/GM EX OINT
TOPICAL_OINTMENT | CUTANEOUS | Status: DC | PRN
  Administered 2011-11-16: 1 via TOPICAL

## 2011-11-16 MED ORDER — OXYCODONE HCL 5 MG/5ML PO SOLN: 5.0000 mg | Freq: Once | ORAL | Status: DC | PRN

## 2011-11-16 MED ORDER — SODIUM CHLORIDE 0.9 % IV SOLN
500.0000 mg | Freq: Two times a day (BID) | INTRAVENOUS | Status: DC
  Administered 2011-11-16 – 2011-11-18 (×4): 500 mg via INTRAVENOUS
  Filled 2011-11-16 (×6): qty 5

## 2011-11-16 MED ORDER — FLEET ENEMA 7-19 GM/118ML RE ENEM
1.0000 | ENEMA | Freq: Once | RECTAL | Status: AC | PRN
  Filled 2011-11-16: qty 1

## 2011-11-16 MED ORDER — FENTANYL CITRATE 0.05 MG/ML IJ SOLN
INTRAMUSCULAR | Status: DC | PRN
  Administered 2011-11-16: 150 ug via INTRAVENOUS

## 2011-11-16 MED ORDER — BUPIVACAINE HCL (PF) 0.25 % IJ SOLN
INTRAMUSCULAR | Status: DC | PRN
  Administered 2011-11-16: 8.5 mL

## 2011-11-16 MED ORDER — SODIUM CHLORIDE 0.9 % IR SOLN
Status: DC | PRN
  Administered 2011-11-16: 17:00:00

## 2011-11-16 MED ORDER — ROCURONIUM BROMIDE 100 MG/10ML IV SOLN
INTRAVENOUS | Status: DC | PRN
  Administered 2011-11-16: 50 mg via INTRAVENOUS

## 2011-11-16 MED ORDER — SODIUM CHLORIDE 0.9 % IV SOLN
INTRAVENOUS | Status: DC
  Administered 2011-11-16: 75 mL/h via INTRAVENOUS

## 2011-11-16 MED ORDER — LIDOCAINE HCL 4 % MT SOLN
OROMUCOSAL | Status: DC | PRN
  Administered 2011-11-16: 3 mL via TOPICAL

## 2011-11-16 MED ORDER — MEPERIDINE HCL 25 MG/ML IJ SOLN: 6.2500 mg | INTRAMUSCULAR | Status: DC | PRN

## 2011-11-16 MED ORDER — PROMETHAZINE HCL 25 MG PO TABS: 12.5000 mg | ORAL_TABLET | ORAL | Status: DC | PRN

## 2011-11-16 MED ORDER — LABETALOL HCL 5 MG/ML IV SOLN
10.0000 mg | INTRAVENOUS | Status: DC | PRN
Start: 2011-11-16 — End: 2011-11-18
  Filled 2011-11-16: qty 8

## 2011-11-16 MED ORDER — ACETAMINOPHEN 650 MG RE SUPP: 650.0000 mg | RECTAL | Status: DC | PRN

## 2011-11-16 MED ORDER — HYDROCODONE-ACETAMINOPHEN 5-325 MG PO TABS: 1.0000 | ORAL_TABLET | ORAL | Status: DC | PRN

## 2011-11-16 SURGICAL SUPPLY — 90 items
ADH SKN CLS APL DERMABOND .7 (GAUZE/BANDAGES/DRESSINGS) ×2
APL SKNCLS STERI-STRIP NONHPOA (GAUZE/BANDAGES/DRESSINGS) ×6
BAG DECANTER FOR FLEXI CONT (MISCELLANEOUS) ×3 IMPLANT
BENZOIN TINCTURE PRP APPL 2/3 (GAUZE/BANDAGES/DRESSINGS) ×3 IMPLANT
BLADE SURG 10 STRL SS (BLADE) ×5 IMPLANT
BLADE SURG 11 STRL SS (BLADE) ×3 IMPLANT
BLADE SURG 15 STRL LF DISP TIS (BLADE) ×2 IMPLANT
BLADE SURG 15 STRL SS (BLADE) ×3
BLADE SURG ROTATE 9660 (MISCELLANEOUS) ×5 IMPLANT
BOOT SUTURE AID YELLOW STND (SUTURE) ×3 IMPLANT
BRUSH SCRUB EZ PLAIN DRY (MISCELLANEOUS) ×6 IMPLANT
BUR ACORN 6.0 PRECISION (BURR) ×3 IMPLANT
CANISTER SUCTION 2500CC (MISCELLANEOUS) ×3 IMPLANT
CATH ROBINSON RED A/P 10FR (CATHETERS) ×2 IMPLANT
CATH VENTRICULAR 14CMX1.4MM (INSTRUMENTS) ×1 IMPLANT
CLOTH BEACON ORANGE TIMEOUT ST (SAFETY) ×3 IMPLANT
CONT SPEC 4OZ CLIKSEAL STRL BL (MISCELLANEOUS) ×1 IMPLANT
CORDS BIPOLAR (ELECTRODE) ×3 IMPLANT
COVER MAYO STAND STRL (DRAPES) ×3 IMPLANT
COVER TABLE BACK 60X90 (DRAPES) ×1 IMPLANT
DECANTER SPIKE VIAL GLASS SM (MISCELLANEOUS) ×3 IMPLANT
DERMABOND ADVANCED (GAUZE/BANDAGES/DRESSINGS) ×1
DERMABOND ADVANCED .7 DNX12 (GAUZE/BANDAGES/DRESSINGS) ×2 IMPLANT
DRAPE INCISE IOBAN 85X60 (DRAPES) ×3 IMPLANT
DRAPE ORTHO SPLIT 77X108 STRL (DRAPES) ×6
DRAPE POUCH INSTRU U-SHP 10X18 (DRAPES) ×3 IMPLANT
DRAPE SURG ORHT 6 SPLT 77X108 (DRAPES) ×2 IMPLANT
DRESSING TELFA 8X3 (GAUZE/BANDAGES/DRESSINGS) ×3 IMPLANT
DRSG OPSITE 4X5.5 SM (GAUZE/BANDAGES/DRESSINGS) ×5 IMPLANT
DRSG TEGADERM 4X4.75 (GAUZE/BANDAGES/DRESSINGS) ×2 IMPLANT
ELECT CAUTERY BLADE 6.4 (BLADE) ×3 IMPLANT
ELECT REM PT RETURN 9FT ADLT (ELECTROSURGICAL) ×3
ELECTRODE REM PT RTRN 9FT ADLT (ELECTROSURGICAL) ×2 IMPLANT
GAUZE SPONGE 2X2 8PLY STRL LF (GAUZE/BANDAGES/DRESSINGS) IMPLANT
GAUZE SPONGE 4X4 16PLY XRAY LF (GAUZE/BANDAGES/DRESSINGS) ×6 IMPLANT
GLOVE BIO SURGEON STRL SZ8 (GLOVE) ×4 IMPLANT
GLOVE BIOGEL PI IND STRL 8 (GLOVE) ×4 IMPLANT
GLOVE BIOGEL PI IND STRL 8.5 (GLOVE) ×2 IMPLANT
GLOVE BIOGEL PI INDICATOR 8 (GLOVE) ×2
GLOVE BIOGEL PI INDICATOR 8.5 (GLOVE) ×1
GLOVE ECLIPSE 7.5 STRL STRAW (GLOVE) ×3 IMPLANT
GLOVE ECLIPSE 8.0 STRL XLNG CF (GLOVE) ×3 IMPLANT
GLOVE EXAM NITRILE LRG STRL (GLOVE) IMPLANT
GLOVE EXAM NITRILE MD LF STRL (GLOVE) ×2 IMPLANT
GLOVE EXAM NITRILE XL STR (GLOVE) IMPLANT
GLOVE EXAM NITRILE XS STR PU (GLOVE) IMPLANT
GOWN BRE IMP SLV AUR LG STRL (GOWN DISPOSABLE) ×2 IMPLANT
GOWN BRE IMP SLV AUR XL STRL (GOWN DISPOSABLE) ×2 IMPLANT
GOWN STRL NON-REIN LRG LVL3 (GOWN DISPOSABLE) ×6 IMPLANT
GOWN STRL REIN 2XL LVL4 (GOWN DISPOSABLE) IMPLANT
HEMOSTAT SURGICEL 2X14 (HEMOSTASIS) IMPLANT
KIT BASIN OR (CUSTOM PROCEDURE TRAY) ×3 IMPLANT
KIT ROOM TURNOVER OR (KITS) ×3 IMPLANT
LAPAROSCOPIC SPECIALTY PACK ×1 IMPLANT
MARKER SKIN DUAL TIP RULER LAB (MISCELLANEOUS) ×1 IMPLANT
NDL HYPO 25X1 1.5 SAFETY (NEEDLE) ×2 IMPLANT
NEEDLE HYPO 25X1 1.5 SAFETY (NEEDLE) ×3 IMPLANT
NS IRRIG 1000ML POUR BTL (IV SOLUTION) ×3 IMPLANT
PACK EENT II TURBAN DRAPE (CUSTOM PROCEDURE TRAY) ×3 IMPLANT
PAD ARMBOARD 7.5X6 YLW CONV (MISCELLANEOUS) ×9 IMPLANT
PATTIES SURGICAL .5 X.5 (GAUZE/BANDAGES/DRESSINGS) IMPLANT
PEEL AWAY INTRODUCER SET SZ. 10.0 FR ×1 IMPLANT
PENCIL BUTTON HOLSTER BLD 10FT (ELECTRODE) ×3 IMPLANT
SHEATH PERITONEAL INTRO 46 (MISCELLANEOUS) IMPLANT
SHEATH PERITONEAL INTRO 61 (MISCELLANEOUS) ×1 IMPLANT
SLEEVE ENDOPATH XCEL 5M (ENDOMECHANICALS) ×2 IMPLANT
SPONGE GAUZE 2X2 STER 10/PKG (GAUZE/BANDAGES/DRESSINGS) ×3
SPONGE GAUZE 4X4 12PLY (GAUZE/BANDAGES/DRESSINGS) ×3 IMPLANT
SPONGE LAP 4X18 X RAY DECT (DISPOSABLE) ×3 IMPLANT
SPONGE SURGIFOAM ABS GEL 100 (HEMOSTASIS) IMPLANT
STAPLER SKIN PROX WIDE 3.9 (STAPLE) ×4 IMPLANT
STRIP CLOSURE SKIN 1/2X4 (GAUZE/BANDAGES/DRESSINGS) ×3 IMPLANT
SUT BONE WAX W31G (SUTURE) IMPLANT
SUT ETHILON 3 0 PS 1 (SUTURE) ×2 IMPLANT
SUT NURALON 4 0 TR CR/8 (SUTURE) IMPLANT
SUT SILK 0 TIES 10X30 (SUTURE) ×3 IMPLANT
SUT SILK 2 0 FS (SUTURE) ×3 IMPLANT
SUT SILK 2 0 TIES 10X30 (SUTURE) ×3 IMPLANT
SUT VIC AB 2-0 CP2 18 (SUTURE) ×4 IMPLANT
SUT VIC AB 3-0 SH 8-18 (SUTURE) ×3 IMPLANT
SYR BULB 3OZ (MISCELLANEOUS) ×3 IMPLANT
SYR CONTROL 10ML LL (SYRINGE) ×4 IMPLANT
TEGADERM ×4 IMPLANT
TOWEL OR 17X24 6PK STRL BLUE (TOWEL DISPOSABLE) ×3 IMPLANT
TOWEL OR 17X26 10 PK STRL BLUE (TOWEL DISPOSABLE) ×4 IMPLANT
TROCAR XCEL BLUNT TIP 100MML (ENDOMECHANICALS) IMPLANT
TROCAR XCEL NON-BLD 5MMX100MML (ENDOMECHANICALS) ×2 IMPLANT
TUBE CONNECTING 12X1/4 (SUCTIONS) ×3 IMPLANT
VALVE PROGRAM W DISTAL CATH (Prosthesis & Implant Heart) ×1 IMPLANT
WATER STERILE IRR 1000ML POUR (IV SOLUTION) ×3 IMPLANT

## 2011-11-16 NOTE — Anesthesia Postprocedure Evaluation (Signed)
  Anesthesia Post-op Note  Patient: Sophia Marks  Procedure(s) Performed: Procedure(s) (LRB) with comments: SHUNT INSERTION VENTRICULAR-PERITONEAL (Right) - Ventricular Peritoneal Shunt Insertion/Revision with Laparoscopic Abdominal Approach LAPAROSCOPIC REVISION VENTRICULAR-PERITONEAL (V-P) SHUNT (N/A) - laproscopic abdominal cath  Patient Location: PACU  Anesthesia Type: General  Level of Consciousness: awake, alert  and oriented  Airway and Oxygen Therapy: Patient Spontanous Breathing and Patient connected to face mask oxygen  Post-op Pain: mild  Post-op Assessment: Post-op Vital signs reviewed  Post-op Vital Signs: Reviewed  Complications: No apparent anesthesia complications

## 2011-11-16 NOTE — Op Note (Signed)
11/14/2011 - 11/16/2011  5:27 PM  PATIENT:  Sophia Marks  31 y.o. female  PRE-OPERATIVE DIAGNOSIS: obstructive hydrocephalus  POST-OPERATIVE DIAGNOSIS:  obstructive hydrocephalus   PROCEDURE:  Procedure(s) (LRB) with comments: SHUNT INSERTION VENTRICULAR-PERITONEAL (Right) - Ventricular Peritoneal Shunt Insertion/Revision with Laparoscopic Abdominal Approach LAPAROSCOPIC REVISION VENTRICULAR-PERITONEAL (V-P) SHUNT (N/A) - laproscopic abdominal cath  SURGEON:  Surgeon(s) and Role: Panel 1:    * Dilyn Osoria, MD - Primary  Panel 2:    * Todd J Rosenbower, MD - Primary  PHYSICIAN ASSISTANT:   ASSISTANTS: none   ANESTHESIA:   general  EBL:  Total I/O In: -  Out: 100 [Blood:100]  BLOOD ADMINISTERED:none  DRAINS: none   LOCAL MEDICATIONS USED:  LIDOCAINE   SPECIMEN:  No Specimen  DISPOSITION OF SPECIMEN:  N/A  COUNTS:  YES  TOURNIQUET:  * No tourniquets in log *  DICTATION: Indications: 31 year old female with obstructive hydrocephalus with previously placed shunt with disconnection and severe headache and it was elected to take her to surgery for ventriculoperitoneal shunt placement. She has had multiple prior abdominal surgeries.  It was elected for patient to have laparoscopic assisted abdominal catheter placement.  Procedure:  Patient was brought to the operating room and underwent smooth and uncomplicated induction of general endotracheal anesthesia.  Her right scalp, chest and abdomen were shaved, prepped and draped in usual fashion.  Right frontal scalp was infiltrated with lidocaine and an incision was made over coronal suture at mid-pupillary line.  Trephine was created, dura was incised with electrocautery.  Shunt was passed to abdomen with a posterior auricular intervening incision.  Tunneler was utilized with cautious placement.  Ventricular catheter was placed, requiring several passes, with brisk flow of CSF. Catheter was hooked to valve and anchored with a  silk tie. Catheter had brisk flow of CSF under pressure.  Catheter was placed in abdominal cavity and incisions were closed by Dr. Rosenbower.  Cranial incisions were closed with 2-0 vicryl sutures and 3-0 Nylon sutures.  Sterile occlusive dressings were placed.  Patient was extubated and taken to recovery in stable condition having tolerated procedure well.  Counts were correct at the end of the case.  PLAN OF CARE: Admit to inpatient   PATIENT DISPOSITION:  PACU - hemodynamically stable.   Delay start of Pharmacological VTE agent (>24hrs) due to surgical blood loss or risk of bleeding: yes  

## 2011-11-16 NOTE — Brief Op Note (Signed)
11/14/2011 - 11/16/2011  5:27 PM  PATIENT:  Sophia Marks  31 y.o. female  PRE-OPERATIVE DIAGNOSIS: obstructive hydrocephalus  POST-OPERATIVE DIAGNOSIS:  obstructive hydrocephalus   PROCEDURE:  Procedure(s) (LRB) with comments: SHUNT INSERTION VENTRICULAR-PERITONEAL (Right) - Ventricular Peritoneal Shunt Insertion/Revision with Laparoscopic Abdominal Approach LAPAROSCOPIC REVISION VENTRICULAR-PERITONEAL (V-P) SHUNT (N/A) - laproscopic abdominal cath  SURGEON:  Surgeon(s) and Role: Panel 1:    * Maeola Harman, MD - Primary  Panel 2:    * Adolph Pollack, MD - Primary  PHYSICIAN ASSISTANT:   ASSISTANTS: none   ANESTHESIA:   general  EBL:  Total I/O In: -  Out: 100 [Blood:100]  BLOOD ADMINISTERED:none  DRAINS: none   LOCAL MEDICATIONS USED:  LIDOCAINE   SPECIMEN:  No Specimen  DISPOSITION OF SPECIMEN:  N/A  COUNTS:  YES  TOURNIQUET:  * No tourniquets in log *  DICTATION: Indications: 31 year old female with obstructive hydrocephalus with previously placed shunt with disconnection and severe headache and it was elected to take her to surgery for ventriculoperitoneal shunt placement. She has had multiple prior abdominal surgeries.  It was elected for patient to have laparoscopic assisted abdominal catheter placement.  Procedure:  Patient was brought to the operating room and underwent smooth and uncomplicated induction of general endotracheal anesthesia.  Her right scalp, chest and abdomen were shaved, prepped and draped in usual fashion.  Right frontal scalp was infiltrated with lidocaine and an incision was made over coronal suture at mid-pupillary line.  Trephine was created, dura was incised with electrocautery.  Shunt was passed to abdomen with a posterior auricular intervening incision.  Tunneler was utilized with cautious placement.  Ventricular catheter was placed, requiring several passes, with brisk flow of CSF. Catheter was hooked to valve and anchored with a  silk tie. Catheter had brisk flow of CSF under pressure.  Catheter was placed in abdominal cavity and incisions were closed by Dr. Abbey Chatters.  Cranial incisions were closed with 2-0 vicryl sutures and 3-0 Nylon sutures.  Sterile occlusive dressings were placed.  Patient was extubated and taken to recovery in stable condition having tolerated procedure well.  Counts were correct at the end of the case.  PLAN OF CARE: Admit to inpatient   PATIENT DISPOSITION:  PACU - hemodynamically stable.   Delay start of Pharmacological VTE agent (>24hrs) due to surgical blood loss or risk of bleeding: yes

## 2011-11-16 NOTE — Progress Notes (Signed)
Awake, alert, conversant.  Headache much improved. Doing well.

## 2011-11-16 NOTE — Progress Notes (Signed)
Subjective: Patient reports headache stable, but persists.  Objective: Vital signs in last 24 hours: Temp:  [98.1 F (36.7 C)-98.6 F (37 C)] 98.6 F (37 C) (10/16 0200) Pulse Rate:  [56-67] 61  (10/16 0200) Resp:  [16-20] 16  (10/16 0200) BP: (130-159)/(73-84) 130/74 mmHg (10/16 0200) SpO2:  [96 %-98 %] 97 % (10/16 0200)  Intake/Output from previous day: 10/15 0701 - 10/16 0700 In: 1110 [P.O.:360; I.V.:750] Out: -  Intake/Output this shift: Total I/O In: 870 [P.O.:120; I.V.:750] Out: -   Physical Exam: Stable  Lab Results:  Basename 11/14/11 1721  WBC 9.5  HGB 14.6  HCT 42.0  PLT 255   BMET  Basename 11/14/11 1721  NA 137  K 3.9  CL 101  CO2 24  GLUCOSE 104*  BUN 13  CREATININE 0.49*  CALCIUM 9.4    Studies/Results: No results found.  Assessment/Plan: VP shunt placement today.    LOS: 2 days    Dorian Heckle, MD 11/16/2011, 6:38 AM

## 2011-11-16 NOTE — Consult Note (Signed)
Reason for Consult:  VP shunt malfunction Referring Physician:   Dr. Anne Hahn is an 31 y.o. female.  HPI: Sophia Marks has had a VP shunt for hydrocephalus for many years.  Sophia Marks had multiple operations with respect to the shunt which Sophia Marks was much younger.  The current shunt has been in since age 39.  It has become disconnected and Sophia Marks is having increasing headaches.  I was asked to assist with the intraperitoneal portion of the shunt placement.  Past Medical History  Diagnosis Date  . Hydrocephalus   . VP (ventriculoperitoneal) shunt status   . Cerebral palsy       Necrotizing enterocolitis as an infant  Past Surgical History  Procedure Date  . Heal surgery        Multiple vp shunts     Small bowel resection for NEC as an infant  History reviewed. No pertinent family history.  Social History:  reports that Sophia Marks has never smoked. Sophia Marks does not have any smokeless tobacco history on file. Sophia Marks reports that Sophia Marks does not use illicit drugs. Her alcohol history not on file.  Allergies: No Known Allergies  Prior to Admission medications   Medication Sig Start Date End Date Taking? Authorizing Provider  norgestrel-ethinyl estradiol (LO/OVRAL,CRYSELLE) 0.3-30 MG-MCG tablet Take 1 tablet by mouth daily.   Yes Historical Provider, MD  valACYclovir (VALTREX) 500 MG tablet Take 500 mg by mouth as needed. Cold sores   Yes Historical Provider, MD     Results for orders placed during the hospital encounter of 11/14/11 (from the past 48 hour(s))  CBC WITH DIFFERENTIAL     Status: Abnormal   Collection Time   11/14/11  5:21 PM      Component Value Range Comment   WBC 9.5  4.0 - 10.5 K/uL    RBC 4.62  3.87 - 5.11 MIL/uL    Hemoglobin 14.6  12.0 - 15.0 g/dL    HCT 16.1  09.6 - 04.5 %    MCV 90.9  78.0 - 100.0 fL    MCH 31.6  26.0 - 34.0 pg    MCHC 34.8  30.0 - 36.0 g/dL    RDW 40.9  81.1 - 91.4 %    Platelets 255  150 - 400 K/uL    Neutrophils Relative 84 (*) 43 - 77 %    Neutro Abs 7.9 (*)  1.7 - 7.7 K/uL    Lymphocytes Relative 13  12 - 46 %    Lymphs Abs 1.3  0.7 - 4.0 K/uL    Monocytes Relative 3  3 - 12 %    Monocytes Absolute 0.3  0.1 - 1.0 K/uL    Eosinophils Relative 0  0 - 5 %    Eosinophils Absolute 0.0  0.0 - 0.7 K/uL    Basophils Relative 0  0 - 1 %    Basophils Absolute 0.0  0.0 - 0.1 K/uL   BASIC METABOLIC PANEL     Status: Abnormal   Collection Time   11/14/11  5:21 PM      Component Value Range Comment   Sodium 137  135 - 145 mEq/L    Potassium 3.9  3.5 - 5.1 mEq/L    Chloride 101  96 - 112 mEq/L    CO2 24  19 - 32 mEq/L    Glucose, Bld 104 (*) 70 - 99 mg/dL    BUN 13  6 - 23 mg/dL    Creatinine, Ser 7.82 (*) 0.50 -  1.10 mg/dL    Calcium 9.4  8.4 - 24.4 mg/dL    GFR calc non Af Amer >90  >90 mL/min    GFR calc Af Amer >90  >90 mL/min   URINALYSIS, ROUTINE W REFLEX MICROSCOPIC     Status: Abnormal   Collection Time   11/15/11  7:50 AM      Component Value Range Comment   Color, Urine YELLOW  YELLOW    APPearance CLOUDY (*) CLEAR    Specific Gravity, Urine 1.022  1.005 - 1.030    pH 6.0  5.0 - 8.0    Glucose, UA NEGATIVE  NEGATIVE mg/dL    Hgb urine dipstick NEGATIVE  NEGATIVE    Bilirubin Urine NEGATIVE  NEGATIVE    Ketones, ur NEGATIVE  NEGATIVE mg/dL    Protein, ur NEGATIVE  NEGATIVE mg/dL    Urobilinogen, UA 0.2  0.0 - 1.0 mg/dL    Nitrite NEGATIVE  NEGATIVE    Leukocytes, UA TRACE (*) NEGATIVE   PREGNANCY, URINE     Status: Normal   Collection Time   11/15/11  7:50 AM      Component Value Range Comment   Preg Test, Ur NEGATIVE  NEGATIVE   URINE MICROSCOPIC-ADD ON     Status: Abnormal   Collection Time   11/15/11  7:50 AM      Component Value Range Comment   Squamous Epithelial / LPF FEW (*) RARE    WBC, UA 3-6  <3 WBC/hpf    RBC / HPF 0-2  <3 RBC/hpf    Bacteria, UA RARE  RARE    Urine-Other MUCOUS PRESENT     SURGICAL PCR SCREEN     Status: Normal   Collection Time   11/15/11 10:57 PM      Component Value Range Comment   MRSA,  PCR NEGATIVE  NEGATIVE    Staphylococcus aureus NEGATIVE  NEGATIVE     No results found.  Review of Systems  Constitutional: Negative for fever and chills.  Gastrointestinal: Negative for abdominal pain and diarrhea.  Neurological:       Headaches.   Blood pressure 160/86, pulse 60, temperature 98.4 F (36.9 C), temperature source Oral, resp. rate 18, height 5' (1.524 m), weight 121 lb 3.2 oz (54.976 kg), last menstrual period 11/07/2011, SpO2 96.00%. Physical Exam  Constitutional: Sophia Marks appears well-developed and well-nourished. No distress.  Neck:       Shunt catheter visible in left neck.  Cardiovascular: Normal rate and regular rhythm.   Respiratory: Effort normal and breath sounds normal.  GI: Soft. There is no tenderness.       Upper midline scar.  Small bilateral subcostal scars. Small left periumbilical scar.    Assessment/Plan: Malfunction vp shunt.  Plan:  Laparoscopic assisted vp shunt placement.  The procedure and risks were discussed with her.  The risks include but are not limited to bleeding, infection, wound problems, anesthesia, injury to intraabdominal organs.  Sophia Marks seems to understand and agrees with the plan.  Discussed with her mother as well.  Eryn Marandola J 11/16/2011, 3:10 PM

## 2011-11-16 NOTE — Progress Notes (Signed)
Patient stable.  Plan VP shunt Wednesday

## 2011-11-16 NOTE — Op Note (Signed)
Operative Note  Sophia Marks female 31 y.o. 11/16/2011  PREOPERATIVE DX:  Hydrocephalus with malfunctioning ventriculoperitoneal shunt  POSTOPERATIVE DX:  Same  PROCEDURE:  Laparoscopic assisted ventriculoperitoneal shunt placement.         Surgeon: Adolph Pollack   Co Surgeon:  Maeola Harman, M.D.  Anesthesia:  General  Indications:   This is a 31 year old female with a long standing ventriculoperitoneal (vp) shunt that is now malfunctioning leading her to have severe headaches.  She now presents for a new vp shunt placement.    Procedure Detail:  She was brought to the operating room, placed supine on the operating table, and a general anesthetic was given.  The head, neck , chest, and abdomen were sterilely prepped and draped.   A 5 mm incision was made in the right subcostal area lateral to a previous scar.  The a 5 mm Optivue trocar and 5mm laparoscope, the pertitoneal cavity was accessed and a pneumoperitoneum was created.  The area under the trocar was inspected and there was no evidence of bleeding or organ injury.  Dense adhesions between the small intestine and upper midline were noted from a previous operation.  The lower midline area was mostly free of adhesions.  Two 5 mm trocars were placed in the lower midline.  The vp shunt catheter was tunneled from the head to the RUQ and a small incision was made in the RUQ and the shunt catheter was brought out the incision.  Fluid was dripping from the catheter.  Under laparoscopic vision, 16 gauge needle was inserted into the peritoneal catheter, through the same incision, followed by a guidewire.  The needle was removed and a dilator-introducer complex was placed over the needle into the peritoneal cavity.  The wire and dilator were removed.  The vp shunt catheter was placed into the peritoneal cavity via the introducer.  The introducer was peeled away.  The catheter was positioned in the right lower quadrant.  The area under  all trocars was inspected.  There was no evidence or bleeding or organ injury.  The trocars were removed and the pneumoperitoneum was released.  All skin incisions were closed with 4-0 Monocryl subcuticular stitches followed by steri strips and a sterile dressing.  She tolerated the procedure well without any apparent complications and was taken to the Pacu in stable condition.

## 2011-11-16 NOTE — Transfer of Care (Signed)
Immediate Anesthesia Transfer of Care Note  Patient: Sophia Marks  Procedure(s) Performed: Procedure(s) (LRB) with comments: SHUNT INSERTION VENTRICULAR-PERITONEAL (Right) - Ventricular Peritoneal Shunt Insertion/Revision with Laparoscopic Abdominal Approach LAPAROSCOPIC REVISION VENTRICULAR-PERITONEAL (V-P) SHUNT (N/A) - laproscopic abdominal cath  Patient Location: PACU  Anesthesia Type: General  Level of Consciousness: awake, alert , oriented and patient cooperative  Airway & Oxygen Therapy: Patient Spontanous Breathing and Patient connected to nasal cannula oxygen  Post-op Assessment: Report given to PACU RN, Post -op Vital signs reviewed and stable, Patient moving all extremities and Patient moving all extremities X 4  Post vital signs: Reviewed and stable  Complications: No apparent anesthesia complications

## 2011-11-16 NOTE — Anesthesia Preprocedure Evaluation (Signed)
Anesthesia Evaluation  Patient identified by MRN, date of birth, ID band Patient awake    Reviewed: Allergy & Precautions, H&P , NPO status , Patient's Chart, lab work & pertinent test results  Airway Mallampati: I TM Distance: >3 FB Neck ROM: Full    Dental   Pulmonary          Cardiovascular     Neuro/Psych H/O CP and Hydrocephalus    GI/Hepatic   Endo/Other    Renal/GU      Musculoskeletal   Abdominal   Peds  Hematology   Anesthesia Other Findings   Reproductive/Obstetrics                           Anesthesia Physical Anesthesia Plan  ASA: II  Anesthesia Plan: General   Post-op Pain Management:    Induction: Intravenous  Airway Management Planned: Oral ETT  Additional Equipment:   Intra-op Plan:   Post-operative Plan: Extubation in OR  Informed Consent: I have reviewed the patients History and Physical, chart, labs and discussed the procedure including the risks, benefits and alternatives for the proposed anesthesia with the patient or authorized representative who has indicated his/her understanding and acceptance.     Plan Discussed with: CRNA and Surgeon  Anesthesia Plan Comments:         Anesthesia Quick Evaluation

## 2011-11-17 MED ORDER — HYDROCODONE-ACETAMINOPHEN 5-325 MG PO TABS: 1.0000 | ORAL_TABLET | ORAL | Status: DC | PRN

## 2011-11-17 NOTE — Progress Notes (Signed)
As above.

## 2011-11-17 NOTE — Progress Notes (Signed)
Awaiting floor bed.

## 2011-11-17 NOTE — Progress Notes (Signed)
Subjective: Patient reports "I feel ok. Just a little headache, but better."  Objective: Vital signs in last 24 hours: Temp:  [97.2 F (36.2 C)-99.6 F (37.6 C)] 99.6 F (37.6 C) (10/17 0700) Pulse Rate:  [60-119] 82  (10/17 0900) Resp:  [12-21] 20  (10/17 0900) BP: (122-174)/(62-101) 139/84 mmHg (10/17 0900) SpO2:  [93 %-98 %] 94 % (10/17 0900)  Intake/Output from previous day: 10/16 0701 - 10/17 0700 In: 1730 [I.V.:1575; IV Piggyback:155] Out: 2925 [Urine:2825; Blood:100] Intake/Output this shift: Total I/O In: 200 [I.V.:150; IV Piggyback:50] Out: 250 [Urine:250]  Alert, conversant. Dressings intact, dry. Sites nontender. Abdomen soft, nontender. BS active.  Lab Results:  [facility] 11/14/11 1721  WBC 9.5  HGB 14.6  HCT 42.0  PLT 255   BMET  Basename 11/14/11 1721  NA 137  K 3.9  CL 101  CO2 24  GLUCOSE 104*  BUN 13  CREATININE 0.49*  CALCIUM 9.4    Studies/Results: No results found.  Assessment/Plan: Improved  LOS: 3 days  Per Dr. Venetia Maxon, ok to advance diet as tolerated; mobilize; will transfer to [facility]. Pt understands we will remove stitches from scalp in 2 weeks in office. Pt knows to change positions slowly and expect possible h/a with position changes as she adapts to new shunt and drainage pressures. Shunt device ID card to pt.    Georgiann Cocker 11/17/2011, 9:12 AM

## 2011-11-17 NOTE — Progress Notes (Signed)
Patient ID: Sophia Marks, female   DOB: [birth year], 31 y.o.   MRN: [id number]  Sitting up in bed, awaiting transfer to floor.  Reporting no h/a now. Incisions with drsgs intact, dry. Anxious re:d/c to home tomorrow. Reviewed care of incisions & need for 2week f/u in office. Reassured re: expectations of h/a with position changes. Will review d/c instructions again tomorrow.  Georgiann Cocker, RN, BSN

## 2011-11-18 NOTE — Progress Notes (Signed)
As above.  D/C home.

## 2011-11-18 NOTE — Progress Notes (Signed)
Subjective: Patient reports "I feel ok. I think I'm ok with going home today."  Objective: Vital signs in last 24 hours: Temp:  [97.6 F (36.4 C)-98.6 F (37 C)] 97.6 F (36.4 C) (10/18 0845) Pulse Rate:  [73-111] 94  (10/18 0845) Resp:  [16-24] 18  (10/18 0845) BP: (119-148)/(63-92) 127/68 mmHg (10/18 0845) SpO2:  [94 %-99 %] 97 % (10/18 0845)  Intake/Output from previous day: 10/17 0701 - 10/18 0700 In: 1900 [P.O.:100; I.V.:1540; IV Piggyback:260] Out: 2055 [Urine:2055] Intake/Output this shift: Total I/O In: 118 [P.O.:118] Out: -   Alert, conversant. No c/o h/a; only intermittent pains at abdominal incisions with movement - reassured. BS active x4, but no BM yet. Abd soft & nontender. Drsgs removed. Steri's at abdominal incisions, without erythema, swelling or tendrness. Scalp incisions with sutures intact. No erythema, swelling, or drainage. Nontender.   Lab Results: No results found for this basename: WBC:2,HGB:2,HCT:2,PLT:2 in the last 72 hours BMET No results found for this basename: NA:2,K:2,CL:2,CO2:2,GLUCOSE:2,BUN:2,CREATININE:2,CALCIUM:2 in the last 72 hours  Studies/Results: No results found.  Assessment/Plan: Improved  LOS: 4 days  Per Dr. Venetia Maxon, d/c IV, d/c to home. Pt verbalizes understanding of d/c instructions. She agrees to call office to schedule 2wk suture removal in office.    Georgiann Cocker 11/18/2011, 9:35 AM

## 2011-11-18 NOTE — Progress Notes (Signed)
Utilization review completed. Malavika Lira, RN, BSN. 

## 2011-11-18 NOTE — Discharge Summary (Signed)
Physician Discharge Summary  Patient ID: Sophia Marks MRN: [id number] DOB/AGE: 05/12/1980 30 y.o.  Admit date: 11/14/2011 Discharge date: 11/18/2011  Admission Diagnoses: obstructive hydrocephalus    Discharge Diagnoses: obstructive hydrocephalus s/p SHUNT INSERTION VENTRICULAR-PERITONEAL (Right) - Ventricular Peritoneal Shunt Insertion/Revision with Laparoscopic Abdominal Approach LAPAROSCOPIC REVISION VENTRICULAR-PERITONEAL (V-P) SHUNT (N/A) - laproscopic abdominal cath   Principal Problem:  *Shunt malfunction Active Problems:  Hydrocephalus   Discharged Condition: good  Hospital Course: [name] was admitted on 11-14-11 with dx obstructive hydrocephalus (old shunt no longer working). Following uncomplicated surgery, she transferred to Neuro ICU initially and then [facility] for observation. She has progressed well.  Consults: None  Significant Diagnostic Studies:   Treatments: surgery: SHUNT INSERTION VENTRICULAR-PERITONEAL (Right) - Ventricular Peritoneal Shunt Insertion/Revision with Laparoscopic Abdominal Approach-Dr. Venetia Maxon LAPAROSCOPIC REVISION VENTRICULAR-PERITONEAL (V-P) SHUNT (N/A) - laproscopic abdominal cath-Dr. Abbey Chatters  Discharge Exam: Blood pressure 127/68, pulse 94, temperature 97.6 F (36.4 C), temperature source Oral, resp. rate 18, height 5' (1.524 m), weight 54.976 kg (121 lb 3.2 oz), last menstrual period 11/07/2011, SpO2 97.00%. Alert, conversant. No c/o h/a; only intermittent pains at abdominal incisions with movement - reassured. BS active x4, but no BM yet. Abd soft & nontender. Drsgs removed. Steri's at abdominal incisions, without erythema, swelling or tendrness. Scalp incisions with sutures intact. No erythema, swelling, or drainage. Nontender.     Disposition: 01-Home or Self Care  Pt verbalizes understanding of d/c instructions. She agrees to call office to schedule 2wk suture removal in office.   Please print for patient information: Call  Hayley at Dr. Fredrich Birks office to schedule 2week suture removal. [phone] Incision Care: OK to shower & use mild shampoo. Wash hair with pads of fingers to prevent hanging sutures with fingernails.  Ok to wash incisions with warm soapy water and washcloth. Rinse & blot dry. No lotions creams or ointments. Allow steri-strips to fall off. We will remove scalp sutures in 2 weeks in the office. Expect mild headaches with sudden position changes for several weeks. Make position changes slowly & lie back down to lessen headache as needed.  Tylenol for headaches is ok. Notify office for fever over 101, incision drainage, swelling, or redness.       Medication List     As of 11/18/2011  9:40 AM    TAKE these medications         norgestrel-ethinyl estradiol 0.3-30 MG-MCG tablet   Commonly known as: LO/OVRAL,CRYSELLE   Take 1 tablet by mouth daily.      valACYclovir 500 MG tablet   Commonly known as: VALTREX   Take 500 mg by mouth as needed. Cold sores         Signed: Georgiann Cocker 11/18/2011, 9:40 AM

## 2011-11-21 NOTE — Discharge Summary (Signed)
As above.

## 2011-11-22 ENCOUNTER — Encounter (HOSPITAL_COMMUNITY): Payer: Self-pay | Admitting: Neurosurgery

## 2012-01-12 ENCOUNTER — Ambulatory Visit: Payer: Medicaid Other | Attending: Neurosurgery | Admitting: Physical Therapy

## 2012-01-12 DIAGNOSIS — R269 Unspecified abnormalities of gait and mobility: Secondary | ICD-10-CM | POA: Insufficient documentation

## 2012-01-12 DIAGNOSIS — IMO0001 Reserved for inherently not codable concepts without codable children: Secondary | ICD-10-CM | POA: Insufficient documentation

## 2012-01-12 DIAGNOSIS — M6281 Muscle weakness (generalized): Secondary | ICD-10-CM | POA: Insufficient documentation

## 2012-01-12 DIAGNOSIS — G808 Other cerebral palsy: Secondary | ICD-10-CM | POA: Insufficient documentation

## 2012-01-17 ENCOUNTER — Ambulatory Visit: Payer: Medicaid Other | Admitting: Physical Therapy

## 2012-01-20 ENCOUNTER — Ambulatory Visit: Payer: Medicaid Other | Admitting: Physical Therapy

## 2012-01-31 ENCOUNTER — Ambulatory Visit: Payer: Medicaid Other | Admitting: Physical Therapy

## 2012-02-14 ENCOUNTER — Ambulatory Visit: Payer: Medicaid Other | Attending: Neurosurgery | Admitting: Physical Therapy

## 2012-02-14 DIAGNOSIS — IMO0001 Reserved for inherently not codable concepts without codable children: Secondary | ICD-10-CM | POA: Insufficient documentation

## 2012-02-14 DIAGNOSIS — M6281 Muscle weakness (generalized): Secondary | ICD-10-CM | POA: Insufficient documentation

## 2012-02-14 DIAGNOSIS — G808 Other cerebral palsy: Secondary | ICD-10-CM | POA: Insufficient documentation

## 2012-02-14 DIAGNOSIS — R269 Unspecified abnormalities of gait and mobility: Secondary | ICD-10-CM | POA: Insufficient documentation

## 2012-02-28 ENCOUNTER — Ambulatory Visit: Payer: Medicaid Other | Admitting: Physical Therapy

## 2012-03-05 ENCOUNTER — Ambulatory Visit: Payer: Medicaid Other | Attending: Neurosurgery | Admitting: Physical Therapy

## 2012-03-05 DIAGNOSIS — G808 Other cerebral palsy: Secondary | ICD-10-CM | POA: Insufficient documentation

## 2012-03-05 DIAGNOSIS — M6281 Muscle weakness (generalized): Secondary | ICD-10-CM | POA: Insufficient documentation

## 2012-03-05 DIAGNOSIS — IMO0001 Reserved for inherently not codable concepts without codable children: Secondary | ICD-10-CM | POA: Insufficient documentation

## 2012-03-05 DIAGNOSIS — R269 Unspecified abnormalities of gait and mobility: Secondary | ICD-10-CM | POA: Insufficient documentation

## 2012-11-12 ENCOUNTER — Ambulatory Visit (INDEPENDENT_AMBULATORY_CARE_PROVIDER_SITE_OTHER): Payer: Medicaid Other | Admitting: Neurology

## 2012-11-12 ENCOUNTER — Encounter: Payer: Self-pay | Admitting: Neurology

## 2012-11-12 VITALS — BP 130/90 | HR 80 | Temp 97.8°F | Ht 60.0 in | Wt 136.0 lb

## 2012-11-12 DIAGNOSIS — R4189 Other symptoms and signs involving cognitive functions and awareness: Secondary | ICD-10-CM

## 2012-11-12 NOTE — Addendum Note (Signed)
Addended by: Benay Spice on: 11/12/2012 02:25 PM   Modules accepted: Orders

## 2012-11-12 NOTE — Progress Notes (Signed)
NEUROLOGY CONSULTATION NOTE  Sophia Marks MRN: [id number] DOB: Oct 10, 1980  Referring provider: Dr. Venetia Maxon  Reason for consult:  Cognitive problems.  HISTORY OF PRESENT ILLNESS: Sophia Marks is a 32 year old right-handed woman with history of cerebral palsy and shunt dependent hydrocephalus who presents for cognitive difficulties.  She is accompanied by her mother.  Records and images were personally reviewed where available.   She was diagnosed with hydrocephalus about 20 years ago.  Her neurosurgeon is Dr. Maeola Harman.  She had a new shunt placed on 11/16/11 after malfunction and disconnect.  Her shunt settings are 78mmH2O pressure.  Her shunt is working well.  She denies headaches or urinary incontinence.  There has been no worsening of gait.  However, since the shunt malfunction, she and her family have noted mild change in cognition.  Specifically, she seems more slow to respond.  When asked a question, she will often have a blank stare for a moment before answering.  There is no loss of awareness.  Sometimes, when she talks, she knows what she wants to say but has word-finding difficulties.  She has been told that she will sometimes repeat herself to other people.  She does have stress and some anxiety, especially work-related.  She sees a Veterinary surgeon without much difference.  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  She is able to read without losing train of thought, without difficulty understanding what she read, and without need to re-read.  She denies difficulty following along when watching TV or a movie.  She does not take medications.  PAST MEDICAL HISTORY: Past Medical History  Diagnosis Date  . Hydrocephalus   . VP (ventriculoperitoneal) shunt status   . Cerebral palsy     PAST SURGICAL HISTORY: Past Surgical History  Procedure Laterality Date  . Heal surgery    . Ventriculoperitoneal shunt  11/16/2011    Procedure: SHUNT INSERTION  VENTRICULAR-PERITONEAL;  Surgeon: Maeola Harman, MD;  Location: [facility];  Service: Neurosurgery;  Laterality: Right;  Ventricular Peritoneal Shunt Insertion/Revision with Laparoscopic Abdominal Approach    MEDICATIONS: Current Outpatient Prescriptions on File Prior to Visit  Medication Sig Dispense Refill  . norgestrel-ethinyl estradiol (LO/OVRAL,CRYSELLE) 0.3-30 MG-MCG tablet Take 1 tablet by mouth daily.      . valACYclovir (VALTREX) 500 MG tablet Take 500 mg by mouth as needed. Cold sores       No current facility-administered medications on file prior to visit.    ALLERGIES: No Known Allergies  FAMILY HISTORY: No family history on file.  SOCIAL HISTORY: History   Social History  . Marital Status: Single    Spouse Name: N/A    Number of Children: N/A  . Years of Education: N/A   Occupational History  . Not on file.   Social History Main Topics  . Smoking status: Never Smoker   . Smokeless tobacco: Never Used  . Alcohol Use: Not on file     Comment: rarely  . Drug Use: No  . Sexual Activity: Not on file   Other Topics Concern  . Not on file   Social History Narrative  . No narrative on file    REVIEW OF SYSTEMS: Constitutional: No fevers, chills, or sweats, no generalized fatigue, change in appetite Eyes: No visual changes, double vision, eye pain Ear, nose and throat: No hearing loss, ear pain, nasal congestion, sore throat Cardiovascular: No chest pain, palpitations Respiratory:  No shortness of breath at rest or  with exertion, wheezes GastrointestinaI: No nausea, vomiting, diarrhea, abdominal pain, fecal incontinence Genitourinary:  No dysuria, urinary retention or frequency Musculoskeletal:  No neck pain, back pain Integumentary: No rash, pruritus, skin lesions Neurological: as above Psychiatric: No depression, insomnia, anxiety Endocrine: No palpitations, fatigue, diaphoresis, mood swings, change in appetite, change in weight, increased  thirst Hematologic/Lymphatic:  No anemia, purpura, petechiae. Allergic/Immunologic: no itchy/runny eyes, nasal congestion, recent allergic reactions, rashes  PHYSICAL EXAM: Filed Vitals:   11/12/12 1253  BP: 130/90  Pulse: 80  Temp: 97.8 F (36.6 C)   General: No acute distress, somewhat flustered. Head:  Normocephalic/atraumatic Neck: supple, no paraspinal tenderness, full range of motion Back: No paraspinal tenderness Heart: regular rate and rhythm Lungs: Clear to auscultation bilaterally. Vascular: No carotid bruits. Neurological Exam: Mental status: Alert.  Orientation: oriented to place and time (6/6).  Visuospatial/Executive:  Able to draw Trail Making test correctly, copied cube in reverse direction, had trouble placing hands of clock to requested time, but was eventually able to do it (4/5).  Naming: intact (3/3).  Attention:  Intact, serial 7 subtraction intact (6/6).  Language: repeat intact (2/2).  Fluency/naming mildly reduced (0/3).  Abstraction intact (2/2).  Delayed recall (0/5).  MOCA 23/30 Cranial nerves: CN I: not tested CN II: pupils equal, round and reactive to light, visual fields intact, fundi unremarkable. CN III, IV, VI:  full range of motion, no nystagmus, no ptosis CN V: facial sensation intact CN VII: upper and lower face symmetric CN VIII: hearing intact CN IX, X: gag intact, uvula midline CN XI: sternocleidomastoid and trapezius muscles intact CN XII: tongue midline Bulk & Tone: mildly increased tone, no fasciculations. Motor: 5-/5 in left triceps, otherwise 5/5. Sensation: temperature and vibration intact Deep Tendon Reflexes: 3+ throughout, toes down Finger to nose testing: no dysmetria Heel to shin: difficulty with left foot. Gait: spastic. Romberg negative.  IMPRESSION: Sophia Marks is a 32 year old woman with CP and hydrocephalus s/p VP shunt revision in 2013, who presents with one year history of cognitive difficulties.  Below-average performance on  MOCA may be secondary to anxiety as she did seem flustered throughout the exam.  This may be secondary to stress and anxiety rather than cognitive impairment.  I don't think it has to do with the shunt malfunction.  PLAN: 1.  Check TSH and B12 levels. 2.  If labs unremarkable, refer for neuropsychological testing.  45 minutes spent with patient and mother, over 50% spent counseling and coordinating care.  Thank you for allowing me to take part in the care of this patient.  Sophia Millet, DO  CC:  Maeola Harman, MD

## 2012-11-12 NOTE — Patient Instructions (Addendum)
I think the memory problems and slow to respond are more likely to do with stress and anxiety.  I don't think it has to do with the shunt malfunction. No scheduled follow up at this time.  We will check thyroid levels and vitamin B12 levels as these can affect cognition.  If these look normal, we will proceed with neuropsychological testing.

## 2013-07-19 ENCOUNTER — Telehealth: Payer: Self-pay | Admitting: Neurology

## 2013-07-19 NOTE — Telephone Encounter (Signed)
[phone] pt would like to talkt o someone about setting up some test please call

## 2013-07-19 NOTE — Telephone Encounter (Signed)
Patient did not have insurance when she was seen in October 2014. She does have this now and would like to go ahead and be referred to neuro psych testing. She will bring her insurance card to us so we can make that referral.

## 2013-07-22 ENCOUNTER — Telehealth: Payer: Self-pay | Admitting: Neurology

## 2013-07-22 NOTE — Telephone Encounter (Signed)
Pt's mother came by the office to bring the medicaid of [place] card. She also stated that she is suppose to schedule an appt For some type of testing (does not remember what type) she only remembers that the insurance card needed to be scanned in.  I have scanned in the card.   C/b [phone]

## 2013-07-25 ENCOUNTER — Other Ambulatory Visit: Payer: Self-pay | Admitting: *Deleted

## 2013-07-25 DIAGNOSIS — R4189 Other symptoms and signs involving cognitive functions and awareness: Secondary | ICD-10-CM

## 2013-07-25 NOTE — Telephone Encounter (Signed)
Let's set her up with neuropsych and have her follow up with me soon after.

## 2013-07-25 NOTE — Telephone Encounter (Signed)
Please call pt [phone]

## 2013-07-25 NOTE — Telephone Encounter (Signed)
Please advise on this patient  If you still want the testing or do you want a office visit first ?

## 2013-07-29 ENCOUNTER — Telehealth: Payer: Self-pay | Admitting: Neurology

## 2013-07-29 ENCOUNTER — Other Ambulatory Visit: Payer: Self-pay | Admitting: *Deleted

## 2013-07-29 DIAGNOSIS — R4189 Other symptoms and signs involving cognitive functions and awareness: Secondary | ICD-10-CM

## 2013-07-29 DIAGNOSIS — R4689 Other symptoms and signs involving appearance and behavior: Secondary | ICD-10-CM

## 2013-07-29 NOTE — Telephone Encounter (Signed)
Pt called requesting to speak to a nurse regarding a referral Dr. Everlena CooperJaffe sent for her. She states that the provider's Office does not accept medicaid and will have to be referred to another office. C/b [phone]

## 2013-12-23 DIAGNOSIS — R413 Other amnesia: Secondary | ICD-10-CM | POA: Diagnosis not present

## 2014-01-08 DIAGNOSIS — F068 Other specified mental disorders due to known physiological condition: Secondary | ICD-10-CM | POA: Diagnosis not present

## 2014-04-08 ENCOUNTER — Other Ambulatory Visit: Payer: Self-pay | Admitting: Neurosurgery

## 2014-04-08 DIAGNOSIS — G911 Obstructive hydrocephalus: Secondary | ICD-10-CM

## 2014-04-14 ENCOUNTER — Telehealth: Payer: Self-pay | Admitting: *Deleted

## 2014-04-14 NOTE — Telephone Encounter (Signed)
Patient would like to know what she needs to next after her referral. Please advise   Call back [phone]

## 2014-04-14 NOTE — Telephone Encounter (Signed)
She has not been seen in quite some time  She had Neuropsy testing in Dec with Dr Leonides CaveZelson  I have scheduled her for 05/02/14 3:45 pm appt  I also requested she get copies of testing and have them faxed to us before appt

## 2014-04-25 ENCOUNTER — Ambulatory Visit
Admission: RE | Admit: 2014-04-25 | Discharge: 2014-04-25 | Disposition: A | Discharge status: Home / Self Care | Payer: Medicaid Other | Source: Ambulatory Visit | Attending: Neurosurgery | Admitting: Neurosurgery

## 2014-04-25 DIAGNOSIS — G911 Obstructive hydrocephalus: Secondary | ICD-10-CM

## 2014-05-02 ENCOUNTER — Ambulatory Visit (INDEPENDENT_AMBULATORY_CARE_PROVIDER_SITE_OTHER): Payer: Medicaid Other | Admitting: Neurology

## 2014-05-02 ENCOUNTER — Encounter: Payer: Self-pay | Admitting: Neurology

## 2014-05-02 VITALS — BP 140/90 | HR 106 | Resp 20 | Ht 60.0 in | Wt 132.6 lb

## 2014-05-02 DIAGNOSIS — R4189 Other symptoms and signs involving cognitive functions and awareness: Secondary | ICD-10-CM | POA: Diagnosis not present

## 2014-05-02 DIAGNOSIS — G43809 Other migraine, not intractable, without status migrainosus: Secondary | ICD-10-CM

## 2014-05-02 DIAGNOSIS — Z982 Presence of cerebrospinal fluid drainage device: Secondary | ICD-10-CM | POA: Diagnosis not present

## 2014-05-02 DIAGNOSIS — Z8669 Personal history of other diseases of the nervous system and sense organs: Secondary | ICD-10-CM

## 2014-05-02 DIAGNOSIS — G43109 Migraine with aura, not intractable, without status migrainosus: Secondary | ICD-10-CM

## 2014-05-02 DIAGNOSIS — G809 Cerebral palsy, unspecified: Secondary | ICD-10-CM

## 2014-05-02 HISTORY — DX: Migraine with aura, not intractable, without status migrainosus: G43.109

## 2014-05-02 HISTORY — DX: Presence of cerebrospinal fluid drainage device: Z98.2

## 2014-05-02 NOTE — Progress Notes (Signed)
NEUROLOGY FOLLOW UP OFFICE NOTE  Pryor Ochoaara E Vanscyoc [id number]  HISTORY OF PRESENT ILLNESS: Sophia Marks Geiselman is a 34 year old right-handed woman with history of cerebral palsy, optic neuritis and shunt dependent hydrocephalus who follows up for cognitive difficulties.  Neuropsychological results and CT of head reviewed.  UPDATE: She underwent neuropsychological testing on 12/23/13, which revealed brain dysfunction in multiple cognitive domains, primarily indicative of posterior right hemisphere dysfunction (non-motor visualspatial processing and organizational tests) and more generalized cortical damage (impairment in auditory memory, semantic fluency and dominant hand fine motor skills).  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  It could not be determined if there has been a cognitive decline since shunt revision in 2013.  TSH and B12 were also ordered, but she did not have these labs performed.  There has been no changes since last visit.  She is otherwise doing well.  A couple of months ago, she had two episodes of ocular migraines.  She developed visual aura of wiggly lines in both eyes, associated with non-throbbing frontal headache, lasting 10 minutes.  CT of the head performed last week was unremarkable, revealing shunt revision and without any evidence of ventriculomegaly  HISTORY: She was diagnosed with hydrocephalus about 20 years ago.  Her neurosurgeon is Dr. Maeola HarmanJoseph Stern.  She had a new shunt placed on 11/16/11 after malfunction and disconnect.  Her shunt settings are 8880mmH2O pressure.  Her shunt is working well.  She denies headaches or urinary incontinence.  There has been no worsening of gait.  However, since the shunt malfunction, she and her family have noted mild change in cognition.  Specifically, she seems more slow to respond.  When asked a question, she will often have a blank stare for a moment before  answering.  There is no loss of awareness.  Sometimes, when she talks, she knows what she wants to say but has word-finding difficulties.  She has been told that she will sometimes repeat herself to other people.  She does have stress and some anxiety, especially work-related.  She sees a Veterinary surgeoncounselor without much difference.  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  She is able to read without losing train of thought, without difficulty understanding what she read, and without need to re-read.  She denies difficulty following along when watching TV or a movie.   PAST MEDICAL HISTORY: Past Medical History  Diagnosis Date  . Hydrocephalus   . VP (ventriculoperitoneal) shunt status   . Cerebral palsy     MEDICATIONS: Current Outpatient Prescriptions on File Prior to Visit  Medication Sig Dispense Refill  . norgestrel-ethinyl estradiol (LO/OVRAL,CRYSELLE) 0.3-30 MG-MCG tablet Take 1 tablet by mouth daily.    . valACYclovir (VALTREX) 500 MG tablet Take 500 mg by mouth as needed. Cold sores     No current facility-administered medications on file prior to visit.    ALLERGIES: No Known Allergies  FAMILY HISTORY: Family History  Problem Relation Age of Onset  . Ataxia Neg Hx   . Chorea Neg Hx   . Dementia Neg Hx   . Mental retardation Neg Hx   . Migraines Neg Hx   . Multiple sclerosis Neg Hx   . Neurofibromatosis Neg Hx   . Neuropathy Neg Hx   . Parkinsonism Neg Hx   . Seizures Neg Hx   . Stroke Neg Hx     SOCIAL HISTORY: History  Social History  . Marital Status: Single    Spouse Name: N/A  . Number of Children: N/A  . Years of Education: N/A   Occupational History  . Not on file.   Social History Main Topics  . Smoking status: Never Smoker   . Smokeless tobacco: Never Used  . Alcohol Use: No     Comment: rarely  . Drug Use: No  . Sexual Activity: No   Other Topics Concern  . Not on file   Social History Narrative    REVIEW OF  SYSTEMS: Constitutional: No fevers, chills, or sweats, no generalized fatigue, change in appetite Eyes: No visual changes, double vision, eye pain Ear, nose and throat: No hearing loss, ear pain, nasal congestion, sore throat Cardiovascular: No chest pain, palpitations Respiratory:  No shortness of breath at rest or with exertion, wheezes GastrointestinaI: No nausea, vomiting, diarrhea, abdominal pain, fecal incontinence Genitourinary:  No dysuria, urinary retention or frequency Musculoskeletal:  No neck pain, back pain Integumentary: No rash, pruritus, skin lesions Neurological: as above Psychiatric: No depression, insomnia, anxiety Endocrine: No palpitations, fatigue, diaphoresis, mood swings, change in appetite, change in weight, increased thirst Hematologic/Lymphatic:  No anemia, purpura, petechiae. Allergic/Immunologic: no itchy/runny eyes, nasal congestion, recent allergic reactions, rashes  PHYSICAL EXAM: Filed Vitals:   05/02/14 1548  BP: 140/90  Pulse: 106  Resp: 20   General: No acute distress Head:  Normocephalic/atraumatic Eyes:  Fundoscopic exam unremarkable without vessel changes, exudates, hemorrhages or papilledema. Neck: supple, no paraspinal tenderness, full range of motion Heart:  Regular rate and rhythm Lungs:  Clear to auscultation bilaterally Back: No paraspinal tenderness Neurological Exam: alert and oriented to person, place, and time. Attention span and concentration intact, recent and remote memory intact, fund of knowledge intact.  Speech fluent and not dysarthric, language intact.  CN II-XII intact. Fundoscopic exam unremarkable without vessel changes, exudates, hemorrhages or papilledema.  Mildly increased tone, muscle strength 5/5 throughout.  Sensation to light touch intact.  Deep tendon reflexes 3+ throughout.  Finger to nose and heel to shin testing intact.  Gait spastic, Romberg negative.  IMPRESSION: Cognitive deficits.  She clearly has cognitive  deficits, but this is likely baseline since birth.  There is no objective way to assess if there has been any further decline since the shunt revision.   Ocular migraine  PLAN: Since these symptoms of slow to respond and word-finding troubles are only really noticed by her family, and it does not interfere with her ability to work, I would not pursue any other testing or treatment.  If it is felt that symptoms worsen, then they may follow up with me again.  Shon Millet, DO  CC: Maeola Harman, MD        Leodis Sias, MD

## 2014-05-02 NOTE — Patient Instructions (Signed)
At this point, we cannot identify any objective cognitive changes since the shunt revision.  If it is not affecting her, I wouldn't pursue other avenues.  Follow up as needed.

## 2014-05-27 ENCOUNTER — Ambulatory Visit
Admission: RE | Admit: 2014-05-27 | Discharge: 2014-05-27 | Disposition: A | Discharge status: Home / Self Care | Payer: Medicaid Other | Source: Ambulatory Visit | Attending: Physician Assistant | Admitting: Physician Assistant

## 2014-05-27 ENCOUNTER — Other Ambulatory Visit: Payer: Self-pay | Admitting: Physician Assistant

## 2014-05-27 DIAGNOSIS — R1030 Lower abdominal pain, unspecified: Secondary | ICD-10-CM

## 2015-09-23 ENCOUNTER — Encounter: Payer: Self-pay | Admitting: Podiatry

## 2015-09-23 ENCOUNTER — Ambulatory Visit (INDEPENDENT_AMBULATORY_CARE_PROVIDER_SITE_OTHER): Payer: Medicaid Other | Admitting: Podiatry

## 2015-09-23 VITALS — BP 119/79 | HR 89 | Resp 12

## 2015-09-23 DIAGNOSIS — B351 Tinea unguium: Secondary | ICD-10-CM | POA: Diagnosis not present

## 2015-09-23 DIAGNOSIS — M79674 Pain in right toe(s): Secondary | ICD-10-CM | POA: Diagnosis not present

## 2015-09-23 NOTE — Patient Instructions (Signed)
Today we discussed the possibility of a mycotic or fungal toenail infection right great toenail further aggravated with a history of injury to the toenail. Nail fragments were submitted to the lab to evaluate for possible fungal infection with the possibility of using oral medication as an option We also discussed possible removal toenail with treatment of the nail root to prevent the nail from regrowing Our office will contact you with the lab results which can take at least 30 days

## 2015-09-23 NOTE — Progress Notes (Signed)
   Subjective:    Patient ID: Sophia Marks, female    DOB: 08-09-1980, 35 y.o.   MRN: [id number]  HPI   This patient presents today concerned of approximately three-month history of a right hallux toenail that is gradually become more deformed, discolored with some tenderness with direct pressure. Patient states that she applied Fungi-Nail to the right hallux toenail for approximately one month without any improvement. She does recall a history of stubbing the right hallux previously and does recall that subsequently nail did change in appearance.  Patient has history of CP requiring AFO on the left lower extremity  Review of Systems  Constitutional: Positive for fatigue.       Objective:   Physical Exam  Orientated 3  Vascular: No calf edema or calf tenderness bilaterally DP and PT pulses 2/4 bilaterally Capillary reflex media bilaterally  Neurological: Sensation to 10 g monofilament wire intact 5/5 bilaterally Vibratory sensation reactive bilaterally Ankle reflex reactive bilaterally  Dermatological: No open skin lesions bilaterally The right hallux toenails hypertrophic, deformed, discolored with the distal nail embedding into the distal toe. There is no surrounding erythema, edema or drainage. 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 Described obtaining nail fragments with debridement with possible treatment with oral medication. Described repetitive debridement as well as permanent toenail removal. At this time patient is interested in possibility of  treating the right hallux nail without permanent nail removal. The right hallux nail was debrided mechanically and electrically with the nail fragments submitted for PAS and fungal culture. Slight bleeding distal right hallux treated with topical antibiotic ointment and Band-Aid  Reappoint patient upon receipt of lab results for PAS and fungal culture

## 2015-11-09 ENCOUNTER — Telehealth: Payer: Self-pay | Admitting: *Deleted

## 2015-11-09 NOTE — Telephone Encounter (Addendum)
Dr. Leeanne Deeduchman reviewed 09/23/2015 fungal cultures as negative, may use Revitaderm40. Left message for pt to call for results. Informed pt of Dr. Theotis Burrowuchman's review and recommendations. Pt states she would like Revitaderm40, I told her we were  Expecting a shipment tomorrow to call the following day to be certain.  Pt states understanding. 11/11/2015-Pt states she is calling to see if the cream Dr. Leeanne Deeduchman recommended is in. I informed pt I have the last Revitaderm40 held for her. Pt states she will pick it up tomorrow.

## 2015-11-13 DIAGNOSIS — B351 Tinea unguium: Secondary | ICD-10-CM

## 2015-12-22 DIAGNOSIS — E039 Hypothyroidism, unspecified: Secondary | ICD-10-CM

## 2015-12-22 HISTORY — DX: Hypothyroidism, unspecified: E03.9

## 2016-01-22 ENCOUNTER — Other Ambulatory Visit: Payer: Self-pay | Admitting: Family Medicine

## 2016-01-22 DIAGNOSIS — E049 Nontoxic goiter, unspecified: Secondary | ICD-10-CM

## 2016-01-27 ENCOUNTER — Other Ambulatory Visit: Payer: Medicaid Other

## 2016-02-04 ENCOUNTER — Ambulatory Visit
Admission: RE | Admit: 2016-02-04 | Discharge: 2016-02-04 | Disposition: A | Discharge status: Home / Self Care | Payer: Medicaid Other | Source: Ambulatory Visit | Attending: Family Medicine | Admitting: Family Medicine

## 2016-02-04 DIAGNOSIS — E049 Nontoxic goiter, unspecified: Secondary | ICD-10-CM

## 2016-03-08 DIAGNOSIS — K219 Gastro-esophageal reflux disease without esophagitis: Secondary | ICD-10-CM | POA: Insufficient documentation

## 2016-03-08 HISTORY — DX: Gastro-esophageal reflux disease without esophagitis: K21.9

## 2017-04-21 ENCOUNTER — Encounter: Payer: Self-pay | Admitting: Neurology

## 2017-05-29 ENCOUNTER — Encounter: Payer: Self-pay | Admitting: Neurology

## 2017-08-07 ENCOUNTER — Encounter

## 2017-08-07 ENCOUNTER — Other Ambulatory Visit (INDEPENDENT_AMBULATORY_CARE_PROVIDER_SITE_OTHER): Payer: Medicaid Other

## 2017-08-07 ENCOUNTER — Encounter: Payer: Self-pay | Admitting: Neurology

## 2017-08-07 ENCOUNTER — Ambulatory Visit (INDEPENDENT_AMBULATORY_CARE_PROVIDER_SITE_OTHER): Payer: Medicaid Other | Admitting: Neurology

## 2017-08-07 VITALS — BP 140/92 | HR 102 | Ht 62.0 in | Wt 148.0 lb

## 2017-08-07 DIAGNOSIS — E538 Deficiency of other specified B group vitamins: Secondary | ICD-10-CM | POA: Diagnosis not present

## 2017-08-07 DIAGNOSIS — G43109 Migraine with aura, not intractable, without status migrainosus: Secondary | ICD-10-CM | POA: Diagnosis not present

## 2017-08-07 DIAGNOSIS — Z982 Presence of cerebrospinal fluid drainage device: Secondary | ICD-10-CM | POA: Diagnosis not present

## 2017-08-07 DIAGNOSIS — R4189 Other symptoms and signs involving cognitive functions and awareness: Secondary | ICD-10-CM

## 2017-08-07 NOTE — Progress Notes (Addendum)
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  Ocular Migraines: She has had ocular migraines for about 3 years.  They present as onset of floaters and squiggly lines in visual field of either eye.  It is associated with unilateral dull non-throbbing headache (above affected eye with aura).  There is no associated nausea, vomiting, photophobia, phonophobia or unilateral numbness or weakness.  Staring at the computer screen may aggravate it (she works as a Adult nurse on Production designer, theatre/television/film).  Laying down for a couple of minutes help relieve it.  The visual aura lasts 2 minutes but the headache lasts 5 to 15 minutes.  They occur sporadically.  She may have months with no migraine but then they may occur once a month for a few months.  Cognitive Deficits: She was diagnosed with hydrocephalus about 20 years ago. Her neurosurgeon is Dr. Maeola Harman. She had a new shunt placed on 11/16/11 after malfunction and disconnect. Her shunt settings are 55mmH2O pressure. Her shunt is working well. She denies headaches or urinary incontinence. There has been no worsening of gait. However, since the shunt malfunction, she and her family have noted mild change in cognition. Specifically, she seems more slow to respond. When asked a question, she will often have a blank stare for a moment before answering. There is no loss of awareness. Sometimes, when she talks, she knows what she wants to say but has word-finding  difficulties. She has been told that she will sometimes repeat herself to other people. She does have stress and some anxiety, especially work-related. She sees a Veterinary surgeon without much difference. 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 She is able to read without losing train of thought, without difficulty understanding what she read, and without need to re-read. She denies difficulty following along when watching TV or a movie.She underwent neuropsychological testing on 12/23/13, which revealed brain dysfunction in multiple cognitive domains, primarily indicative of posterior right hemisphere dysfunction (non-motor visualspatial processing and organizational tests) and more generalized cortical damage (impairment in auditory memory, semantic fluency and dominant hand fine motor skills).  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  It could not be determined if there has been a cognitive decline since shunt revision in 2013.  Over the past couple of years, her mother feels that her memory has gotten worse.  CT head without contrast from 04/25/14 personally reviewed and demonstarted resolved ventriculomegaly with no new intracranial abnormalities or abnormality of the shunt. PAST MEDICAL HISTORY: Past Medical History:  Diagnosis Date  . Cerebral palsy (HCC)   . Hydrocephalus   . VP (ventriculoperitoneal) shunt status     PAST SURGICAL HISTORY: Past Surgical History:  Procedure Laterality Date  . heal surgery    . VENTRICULOPERITONEAL SHUNT  11/16/2011   Procedure: SHUNT INSERTION VENTRICULAR-PERITONEAL;  Surgeon: Maeola Harman, MD;  Location: [facility];  Service: Neurosurgery;  Laterality: Right;  Ventricular Peritoneal Shunt Insertion/Revision with Laparoscopic Abdominal Approach    MEDICATIONS: Current Outpatient Medications on  File Prior to Visit  Medication Sig Dispense  Refill  . levothyroxine (SYNTHROID, LEVOTHROID) 50 MCG tablet Take 50 mcg by mouth daily.    . hydrOXYzine (ATARAX/VISTARIL) 25 MG tablet Take 25 mg by mouth 3 (three) times daily as needed.    . norethindrone-ethinyl estradiol (JUNEL FE,GILDESS FE,LOESTRIN FE) 1-20 MG-MCG tablet Take 1 tablet by mouth daily.    . norgestrel-ethinyl estradiol (LO/OVRAL,CRYSELLE) 0.3-30 MG-MCG tablet Take 1 tablet by mouth daily.    . sertraline (ZOLOFT) 100 MG tablet Take 100 mg by mouth daily.    . valACYclovir (VALTREX) 500 MG tablet Take 500 mg by mouth as needed. Cold sores     No current facility-administered medications on file prior to visit.     ALLERGIES: No Known Allergies  FAMILY HISTORY: Family History  Problem Relation Age of Onset  . Hypertension Mother   . CVA Paternal Grandmother   . Breast cancer Paternal Grandmother   . Ataxia Neg Hx   . Chorea Neg Hx   . Dementia Neg Hx   . Mental retardation Neg Hx   . Migraines Neg Hx   . Multiple sclerosis Neg Hx   . Neurofibromatosis Neg Hx   . Neuropathy Neg Hx   . Parkinsonism Neg Hx   . Seizures Neg Hx   . Stroke Neg Hx     SOCIAL HISTORY: Social History   Socioeconomic History  . Marital status: Single    Spouse name: Not on file  . Number of children: Not on file  . Years of education: Not on file  . Highest education level: Not on file  Occupational History  . Not on file  Social Needs  . Financial resource strain: Not on file  . Food insecurity:    Worry: Not on file    Inability: Not on file  . Transportation needs:    Medical: Not on file    Non-medical: Not on file  Tobacco Use  . Smoking status: Never Smoker  . Smokeless tobacco: Never Used  Substance and Sexual Activity  . Alcohol use: No    Alcohol/week: 0.0 oz    Comment: rarely  . Drug use: No  . Sexual activity: Never  Lifestyle  . Physical activity:    Days per week: Not on file    Minutes per session: Not on file  . Stress: Not on file    Relationships  . Social connections:    Talks on phone: Not on file    Gets together: Not on file    Attends religious service: Not on file    Active member of club or organization: Not on file    Attends meetings of clubs or organizations: Not on file    Relationship status: Not on file  . Intimate partner violence:    Fear of current or ex partner: Not on file    Emotionally abused: Not on file    Physically abused: Not on file    Forced sexual activity: Not on file  Other Topics Concern  . Not on file  Social History Narrative  . Not on file    REVIEW OF SYSTEMS: Constitutional: No fevers, chills, or sweats, no generalized fatigue, change in appetite Eyes: No visual changes, double vision, eye pain Ear, nose and throat: No hearing loss, ear pain, nasal congestion, sore throat Cardiovascular: No chest pain, palpitations Respiratory:  No shortness of breath at rest or with exertion, wheezes GastrointestinaI: No nausea, vomiting, diarrhea, abdominal pain, fecal incontinence  Genitourinary:  No dysuria, urinary retention or frequency Musculoskeletal:  No neck pain, back pain Integumentary: No rash, pruritus, skin lesions Neurological: as above Psychiatric: No depression, insomnia, anxiety Endocrine: No palpitations, fatigue, diaphoresis, mood swings, change in appetite, change in weight, increased thirst Hematologic/Lymphatic:  No purpura, petechiae. Allergic/Immunologic: no itchy/runny eyes, nasal congestion, recent allergic reactions, rashes  PHYSICAL EXAM: Vitals:   08/07/17 0913  BP: (!) 140/92  Pulse: (!) 102  SpO2: 98%   General: No acute distress.  Patient appears well-groomed.  Head:  Normocephalic/atraumatic Eyes:  fundi examined but not visualized Neck: supple, no paraspinal tenderness, full range of motion Back: No paraspinal tenderness Heart: regular rate and rhythm Lungs: Clear to auscultation bilaterally. Vascular: No carotid bruits. Neurological  Exam: Mental status: alert and oriented to person, place, and time, recent memory poor, remote memory intact, fund of knowledge intact, attention and concentration intact, speech fluent and not dysarthric, language intact. Cranial nerves: CN I: not tested CN II: pupils equal, round and reactive to light, visual fields intact CN III, IV, VI:  full range of motion, no nystagmus, no ptosis CN V: facial sensation intact CN VII: upper and lower face symmetric CN VIII: hearing intact CN IX, X: gag intact, uvula midline CN XI: sternocleidomastoid and trapezius muscles intact CN XII: tongue midline Bulk & Tone: mildly increased tone, flexion of left wrist, dystonic posturing of feet, no fasciculations. Motor:  5-/5 left triceps, left foot drop, otherwise 5/5 throughout  Sensation: temperature and vibration sensation intact. Deep Tendon Reflexes:  3+ throughout, toes downgoing. Finger to nose testing:  Without dysmetria.  Heel to shin:  Without dysmetria, difficulty with left foot. Gait:  Spastic. Romberg negative.  IMPRESSION: 1.  Ocular migraines.  As they are infrequent, no preventative is required at this time. 2.  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  As there was no prior testing, it could not be determined if worse secondary to shunt revision in 2013.  Now, endorses worsening memory since last evaluation.   PLAN: 1.  We will refer for repeat neuropsychological testing.  We will provide prior testing for comparison. 2.  Check B12.  She is treated for hypothyroidism.  Will obtain thyroid labs from PCP. 3.  Follow up after testing.  Thank you for allowing me to take part in the care of this patient.  Shon Millet, DO  CC:  Leodis Sias, MD  Jarrett Soho, PA

## 2017-08-07 NOTE — Patient Instructions (Addendum)
1.  For cognitive changes, we will check a B12 level.  We will get the thyroid labs from your PCP. 2.  We will refer you for neurocognitive testing. 3.  Follow up after testing.  Your provider has requested that you have labwork completed today. Please go to [facility] ([id number]) on the second floor of this building before leaving the office today. You do not need to check in. If you are not called within 15 minutes please check with the front desk.   [facility]  [phone] [address], [place], [place] [id number]

## 2017-08-08 ENCOUNTER — Telehealth: Payer: Self-pay

## 2017-08-08 LAB — VITAMIN B12: Vitamin B-12: 367 pg/mL (ref 211–911)

## 2017-08-08 NOTE — Telephone Encounter (Signed)
Called and spoke with Pt, advised her of normal B12

## 2017-08-08 NOTE — Telephone Encounter (Signed)
-----   Message from Drema DallasAdam R Jaffe, DO sent at 08/08/2017  4:40 PM EDT ----- b12 is normal

## 2017-08-10 ENCOUNTER — Ambulatory Visit: Payer: Medicaid Other | Admitting: Psychology

## 2017-08-10 DIAGNOSIS — R413 Other amnesia: Secondary | ICD-10-CM

## 2017-08-10 DIAGNOSIS — G919 Hydrocephalus, unspecified: Secondary | ICD-10-CM

## 2017-08-10 DIAGNOSIS — Z982 Presence of cerebrospinal fluid drainage device: Secondary | ICD-10-CM

## 2017-08-10 DIAGNOSIS — G809 Cerebral palsy, unspecified: Secondary | ICD-10-CM

## 2017-08-10 NOTE — Progress Notes (Signed)
NEUROBEHAVIORAL STATUS EXAM   Name: Sophia Marks Date of Birth: 09-30-1980 Date of Interview: 08/10/2017  Reason for Referral:  Sophia Marks is a 37 y.o. female who is referred for neuropsychological evaluation by Dr. Shon MilletAdam Jaffe of [facility] due to concerns about cognitive deficits. This patient is unaccompanied in the office for today's appointment. 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 She was first seen by Dr. Everlena CooperJaffe in 10/2012 for subjective cognitive changes after she underwent a right ventriculoperitoneal shunt revision in 11/2011. She completed neuropsychological testing with Dr. Leonides CaveZelson in 12/2013. At that time, she and her mother reported that since her shunt revision in 2013, she was demonstrating slower mental speed, forgetfulness, word finding difficulty and trouble following multi-step commands. She and her mother did not feel cognitive problems had worsened or improved over the two years since her shunt revision. They also cited emotional changes post shunt revision characterized by being more "on edge" and easily irritated. 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 Neuropsychological evaluation identified deficits in multiple cognitive domains, most prominently for visual-spatial organizational/constructional skills, memory and manual fine motor speed/coordination. She also demonstrated indications of slowed information processing speed and variable working memory span. There were no indications of aphasia though naming to confrontation and phonemic fluency were lower than expected within the low average range, and semantic fluency was impaired. Her  performances on tests of executive functioning were mixed with her frustration to cognitive challenge apparently leading to more hurried and less accurate responding. Her focused attention, fund of acquired verbal knowledge, ability to shift set and abstract verbal reasoning were within normal expectations. Overall, evaluation was suggestive of brain dysfunction characterized by posterior right hemisphere dysfunction as well as more generalized cortical damage. In the absence of baseline psychometric data, it was not possible to confirm or refute her claim that her cognitive functioning declined after she underwent shunt revision in 2013. It was noted that her report of having had learning difficulties that necessitated special education services upon entering formal schooling coupled with her medical history would suggest the likelihood of congential onset brain dysfunction. It was also noted that she reported clinically significant levels of depression and anxiety on self-report questionnaires.  Head CT performed on 04/25/2014 showed revision of ventricular shunting since 2013 with resolved ventriculomegaly, and no new intracranial abnormality identified and no abnormality of the visible shunt catheters.  The patient followed up with Dr. Everlena CooperJaffe in 05/2014 and 07/2017. Her mother reported that the patient's memory seems to have gotten worse over the past couple of years. Thus, neuropsychological re-evaluation was indicated.  Current Functioning:  At today's visit (08/10/2017), the patient reports significant difficulty retaining new information. For example, in her work as a Energy managertranscriptionist, she has much more difficulty learning new programs and new instructions. It doesn't matter how many times she is taught something new, she keeps forgetting it. "It's like the slate is wiped clean every time". She feels this has progressively worsened over time since her shunt revision in 2013.   Upon direct questioning,  the patient reported the following with regard to current cognitive functioning:   Forgetting recent conversations/events: Yes for conversations (which she says is in stark contrast to how she  was before the shunt revision), no for events  Repeating statements/questions: Yes Misplacing/losing items: Trouble remembering where I left my phone or keys, but not losing them Forgetting appointments or other obligations: No Forgetting to take medications: no Forgetting to pay bills: No Difficulty concentrating: No Starting but not finishing tasks: No Distracted easily: Yes, when she gets requests from multiple people while she is working, it is very distracting to her Processing information more slowly: Yes Word-finding difficulty: Yes Comprehension difficulty: Yes at times when speaking to someone, and she does have trouble with comprehending written instructions  The patient is now living on her own in an apartment. She moved out of her parents' house 2 years ago. She likes living on her own but would like to be even more independent. She has a driver's license but does not drive because her parents do not think she should. This is a major frustration of hers, that she could pass the driving test but her parents still don't think she can drive. Her family provides most of the transportation for her, but she does take Lyft occasionally. She is able to do her own shopping and manage her own appointments, finances/bill paying, housekeeping/laundry, and medication. 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 She would like something more stable (one job that is full time and pays the bills; her parents are currently supplementing her income).   In her free time, she enjoys spending time with her cat and volunteering at an Furniture conservator/restorer. She has a couple of friends but is spending less time with them because they are busy and  have other obligations. She would like a boyfriend but is not currently dating. She does attend some events as part of a singles group. She loves music and attending concerts. She enjoys spending time with her nieces and nephews.   Physically, the patient complains of significant fatigue, stating she is tired all the time. She feels it must be due to her thyroid problem, but apparently her bloodwork was normal. She does have difficulty falling asleep at night, and then in the morning she has a hard time getting out of bed because she feels she has no energy. She is not exercising because she does not feel she has enough energy to do so, but she knows she needs to.  She also complains of recent onset (within the past month) of reduced grip strength in her hands or at least her dominant (right) hand. She loses her grip when she is holding/grasping an object and is dropping things. She stated she did not bring this up at her recent appointment with Dr. Everlena Cooper because her mother was with her, and her mother thinks she is somewhat dramatic about her symptoms.   Psychiatric History: The patient denies any psychiatric history prior to her shunt revision in 2013. As noted previously, she and her family noticed a change in mood after the shunt revision in that she was more easily irritated and "on edge". She reported that she had suicidal ideation and actually attempted suicide on one occasion. She was prescribed sertraline and hydroxyzine. She reports her mood has improved significantly over time and she was able to wean off of sertraline. She still takes hydroxyzine occasionally, as needed, "if I feel I just want to jump out of my skin". Irritation with her neighbors being loud is what typically sets her off. However, for the most part,  she reports her mood is "fine". She thinks her irritability/anxiety has probably stayed relatively stable since the shunt revision or possibly is mildly improved. She denies  depressed mood or anhedonia. She endorses chronic stress and frustration related to what she perceives as her family being overly protective of her. She denies current suicidal ideation or intention. When she has passive suicidal thoughts, she is able to quickly think of her family and cat and not wanting to leave them. The patient was seeing a counselor in the past but she reports she did not find it that helpful. She was unsure if she has had family therapy in the past. She denied history of substance abuse or dependence.   Social History: Born/Raised: The patient was raised with three siblings (2 sisters and a brother).  Education: Scientist, research (physical sciences) (music entertainment), Bachelor's degree (English) + certifications (EHR, medical transcription) Occupational history: Museum/gallery exhibitions officer. She wants to do something else but isn't sure what she can do given both her physical limitations (her family tends to worry more about this) and her cognitive limitations (she can only do one task at a time and has difficulty learning new procedures/skills). Her dream would be to move to [place] and work in [employer]. She did work with [employer], but "they put a bad taste in my mouth" . She did not feel it was helpful. Marital history: Single, never married, no children Alcohol: On occasion, usually 2 glasses of wine Tobacco: None   Medical History: Past Medical History:  Diagnosis Date  . Cerebral palsy (HCC)   . Hydrocephalus   . VP (ventriculoperitoneal) shunt status      Current Medications:  Outpatient Encounter Medications as of 08/10/2017  Medication Sig  . hydrOXYzine (ATARAX/VISTARIL) 25 MG tablet Take 25 mg by mouth 3 (three) times daily as needed.  Marland Kitchen levothyroxine (SYNTHROID, LEVOTHROID) 50 MCG tablet Take 50 mcg by mouth daily.  . norethindrone-ethinyl estradiol (JUNEL FE,GILDESS FE,LOESTRIN FE) 1-20 MG-MCG tablet Take 1 tablet by mouth daily.  . norgestrel-ethinyl  estradiol (LO/OVRAL,CRYSELLE) 0.3-30 MG-MCG tablet Take 1 tablet by mouth daily.  . sertraline (ZOLOFT) 100 MG tablet Take 100 mg by mouth daily.  . valACYclovir (VALTREX) 500 MG tablet Take 500 mg by mouth as needed. Cold sores   No facility-administered encounter medications on file as of 08/10/2017.    Patient is no longer taking sertraline.   Behavioral Observations:   Appearance: Neatly, casually and appropriately dressed and groomed Gait: Ambulated independently Speech: Fluent; normal rate, rhythm and volume. No significant word finding difficulty observed during conversational speech. Thought process: Generally linear and goal directed Affect: Full, euthymic, mildly anxious Interpersonal: Very pleasant, appropriate   60 minutes spent face-to-face with patient completing neurobehavioral status exam. 70 minutes spent integrating medical records/clinical data and completing this report. CPT codes [id number] unit; [id number] unit.   TESTING: There is medical necessity to proceed with neuropsychological assessment as the results will be used to aid in differential diagnosis and clinical decision-making and to inform specific treatment recommendations. 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  Clinical Decision Making: In considering the patient's current level of functioning, level of presumed impairment, nature of symptoms, emotional and behavioral responses during the interview, level of literacy, and observed level of motivation, a battery of tests was selected and communicated to the psychometrician.    PLAN:  The patient will return to complete the above referenced full battery of neuropsychological testing with a psychometrician under my supervision. Education regarding testing procedures was  provided to the patient. Subsequently, the patient will see this provider for a follow-up session at which time her test performances and my impressions and treatment recommendations will be reviewed in detail.  Evaluation ongoing; full report to follow.

## 2017-08-11 ENCOUNTER — Encounter: Payer: Self-pay | Admitting: Psychology

## 2017-08-15 ENCOUNTER — Encounter: Payer: Self-pay | Admitting: Psychology

## 2017-08-15 ENCOUNTER — Ambulatory Visit: Payer: Medicaid Other | Admitting: Psychology

## 2017-08-15 DIAGNOSIS — G919 Hydrocephalus, unspecified: Secondary | ICD-10-CM

## 2017-08-15 NOTE — Progress Notes (Signed)
   Neuropsychology Note  Sophia Marks completed 120 minutes of neuropsychological testing with technician, Wallace Kellerana Nori Poland, BS, under the supervision of Dr. Elvis CoilMaryBeth Bailar, Licensed Psychologist. The patient did not appear overtly distressed by the testing session, per behavioral observation or via self-report to the technician. Rest breaks were offered.   Clinical Decision Making: In considering the patient's current level of functioning, level of presumed impairment, nature of symptoms, emotional and behavioral responses during the interview, level of literacy, and observed level of motivation/effort, a battery of tests was selected and communicated to the psychometrician.  Communication between the psychologist and technician was ongoing throughout the testing session and changes were made as deemed necessary based on patient performance on testing, technician observations and additional pertinent factors such as those listed above.  Sophia Marks will return within approximately 2 weeks for an interactive feedback session with Dr. Alinda DoomsBailar at which time her test performances, clinical impressions and treatment recommendations will be reviewed in detail. The patient understands she can contact our office should she require our assistance before this time.  35 minutes spent performing neuropsychological evaluation services/clinical decision making (psychologist). [CPT 96132] 120 minutes spent face-to-face with patient administering standardized tests, 30 minutes spent scoring (technician). [CPT [id number], 96139]  Full report to follow.

## 2017-08-24 ENCOUNTER — Encounter: Payer: Medicaid Other | Admitting: Psychology

## 2017-08-24 NOTE — Progress Notes (Signed)
NEUROPSYCHOLOGICAL EVALUATION   Name:    Sophia Marks  Date of Birth:   1980/03/17 Date of Interview:  08/10/2017 Date of Testing:  08/15/2017   Date of Feedback:  08/28/2017       Background Information:  Reason for Referral:  Sophia Marks is a 37 y.o. female referred by Dr. Shon Millet to assess her current level of cognitive functioning and assist in differential diagnosis. The current evaluation consisted of a review of available medical records, an interview with the patient, and the completion of a neuropsychological testing battery. Informed consent was obtained.  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 She was first seen by Dr. Everlena Cooper in 10/2012 for subjective cognitive changes after she underwent a right ventriculoperitoneal shunt revision in 11/2011. She completed neuropsychological testing with Dr. Leonides Cave in 12/2013. At that time, she and her mother reported that since her shunt revision in 2013, she was demonstrating slower mental speed, forgetfulness, word finding difficulty and trouble following multi-step commands. She and her mother did not feel cognitive problems had worsened or improved over the two years since her shunt revision. They also cited emotional changes post shunt revision characterized by being more "on edge" and easily irritated. 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 Neuropsychological evaluation identified deficits in multiple cognitive domains, most prominently for visual-spatial organizational/constructional skills, memory and manual fine motor speed/coordination. She also demonstrated indications of slowed information processing speed and variable working memory span. There were no indications of aphasia though naming to confrontation and  phonemic fluency were lower than expected within the low average range, and semantic fluency was impaired. Her performances on tests of executive functioning were mixed with her frustration to cognitive challenge apparently leading to more hurried and less accurate responding. Her focused attention, fund of acquired verbal knowledge, ability to shift set and abstract verbal reasoning were within normal expectations. Overall, evaluation was suggestive of brain dysfunction characterized by posterior right hemisphere dysfunction as well as more generalized cortical damage. In the absence of baseline psychometric data, it was not possible to confirm or refute her claim that her cognitive functioning declined after she underwent shunt revision in 2013. It was noted that her report of having had learning difficulties that necessitated special education services upon entering formal schooling coupled with her medical history would suggest the likelihood of congential onset brain dysfunction. It was also noted that she reported clinically significant levels of depression and anxiety on self-report questionnaires.  Head CT performed on 04/25/2014 showed revision of ventricular shunting since 2013 with resolved ventriculomegaly, and no new intracranial abnormality identified and no abnormality of the visible shunt catheters.  The patient followed up with Dr. Everlena Cooper in 05/2014 and 07/2017. Her mother reported that the patient's memory seems to have gotten worse over the past couple of years. Thus, neuropsychological re-evaluation was indicated.  Current Functioning: At today's visit (08/10/2017), the patient reports significant difficulty retaining new information. For example, in her work as a Energy manager, she has much more difficulty learning new programs and new instructions. It doesn't matter how many times she is taught something new, she keeps forgetting it. "It's like the slate is wiped clean every time". She  feels this has progressively worsened over time since her shunt revision in 2013.   Upon direct questioning, the patient reported the  following with regard to current cognitive functioning:   Forgetting recent conversations/events: Yes for conversations (which she says is in stark contrast to how she was before the shunt revision), no for events  Repeating statements/questions: Yes Misplacing/losing items: Trouble remembering where I left my phone or keys, but not losing them Forgetting appointments or other obligations: No Forgetting to take medications: no Forgetting to pay bills: No Difficulty concentrating: No Starting but not finishing tasks: No Distracted easily: Yes, when she gets requests from multiple people while she is working, it is very distracting to her Processing information more slowly: Yes Word-finding difficulty: Yes Comprehension difficulty: Yes at times when speaking to someone, and she does have trouble with comprehending written instructions  The patient is now living on her own in an apartment. She moved out of her parents' house 2 years ago. She likes living on her own but would like to be even more independent. She has a driver's license but does not drive because her parents do not think she should. This is a major frustration of hers, that she could pass the driving test but her parents still don't think she can drive. Her family provides most of the transportation for her, but she does take Lyft occasionally. She is able to do her own shopping and manage her own appointments, finances/bill paying, housekeeping/laundry, and medication. 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 She would like something more stable (one job that is full time and pays the bills; her parents are currently supplementing her income).   In her free time, she enjoys spending time with her cat and volunteering  at an Furniture conservator/restorer. She has a couple of friends but is spending less time with them because they are busy and have other obligations. She would like a boyfriend but is not currently dating. She does attend some events as part of a singles group. She loves music and attending concerts. She enjoys spending time with her nieces and nephews.   Physically, the patient complains of significant fatigue, stating she is tired all the time. She feels it must be due to her thyroid problem, but apparently her bloodwork was normal. She does have difficulty falling asleep at night, and then in the morning she has a hard time getting out of bed because she feels she has no energy. She is not exercising because she does not feel she has enough energy to do so, but she knows she needs to.  She also complains of recent onset (within the past month) of reduced grip strength in her hands or at least her dominant (right) hand. She loses her grip when she is holding/grasping an object and is dropping things. She stated she did not bring this up at her recent appointment with Dr. Everlena Cooper because her mother was with her, and her mother thinks she is somewhat dramatic about her symptoms.   Psychiatric History: The patient denies any psychiatric history prior to her shunt revision in 2013. As noted previously, she and her family noticed a change in mood after the shunt revision in that she was more easily irritated and "on edge". She reported that she had suicidal ideation and actually attempted suicide on one occasion. She was prescribed sertraline and hydroxyzine. She reports her mood has improved significantly over time and she was able to wean off of sertraline. She still takes hydroxyzine occasionally, as needed, "if I feel I  just want to jump out of my skin". Irritation with her neighbors being loud is what typically sets her off. However, for the most part, she reports her mood is "fine". She thinks her  irritability/anxiety has probably stayed relatively stable since the shunt revision or possibly is mildly improved. She denies depressed mood or anhedonia. She endorses chronic stress and frustration related to what she perceives as her family being overly protective of her. She denies current suicidal ideation or intention. When she has passive suicidal thoughts, she is able to quickly think of her family and cat and not wanting to leave them. The patient was seeing a counselor in the past but she reports she did not find it that helpful. She was unsure if she has had family therapy in the past. She denied history of substance abuse or dependence.   Social History: Born/Raised: The patient was raised with three siblings (2 sisters and a brother).  Education: Scientist, research (physical sciences) (music entertainment), Bachelor's degree (English) + certifications (EHR, medical transcription) Occupational history: Museum/gallery exhibitions officer. She wants to do something else but isn't sure what she can do given both her physical limitations (her family tends to worry more about this) and her cognitive limitations (she can only do one task at a time and has difficulty learning new procedures/skills). Her dream would be to move to [place] and work in [employer]. She did work with [employer], but "they put a bad taste in my mouth" . She did not feel it was helpful. Marital history: Single, never married, no children Alcohol: On occasion, usually 2 glasses of wine Tobacco: None   Medical History:  Past Medical History:  Diagnosis Date  . Cerebral palsy (HCC)   . Hydrocephalus   . VP (ventriculoperitoneal) shunt status     Current medications:  Outpatient Encounter Medications as of 08/28/2017  Medication Sig  . hydrOXYzine (ATARAX/VISTARIL) 25 MG tablet Take 25 mg by mouth 3 (three) times daily as needed.  Marland Kitchen levothyroxine (SYNTHROID, LEVOTHROID) 50 MCG tablet Take 50 mcg by mouth daily.  .  norethindrone-ethinyl estradiol (JUNEL FE,GILDESS FE,LOESTRIN FE) 1-20 MG-MCG tablet Take 1 tablet by mouth daily.  . norgestrel-ethinyl estradiol (LO/OVRAL,CRYSELLE) 0.3-30 MG-MCG tablet Take 1 tablet by mouth daily.  . sertraline (ZOLOFT) 100 MG tablet Take 100 mg by mouth daily.  . valACYclovir (VALTREX) 500 MG tablet Take 500 mg by mouth as needed. Cold sores   No facility-administered encounter medications on file as of 08/28/2017.     Patient is no longer taking sertraline.   Current Examination:  Behavioral Observations:  Appearance: Neatly, casually and appropriately dressed and groomed Gait: Ambulated independently Speech: Fluent; normal rate, rhythm and volume. No significant word finding difficulty observed during conversational speech. Thought process: Generally linear and goal directed Affect: Full, euthymic, mildly anxious Interpersonal: Very pleasant, appropriate Orientation: Oriented to person, place, and current month and year. She was not oriented to day (one day off) or date. Accurately named the current President and his predecessor.   Tests Administered: . Test of Premorbid Functioning (TOPF) . Wechsler Adult Intelligence Scale-Fourth Edition (WAIS-IV): Similarities, Information, Block Design, Matrix Reasoning, Arithmetic, Symbol Search, Coding and Digit Span subtests . Wechsler Memory Scale-Fourth Edition (WMS-IV) Adult Version (ages 42-69): Logical Memory I, II and Recognition subtests  . [place] Verbal Learning Test - 2nd Edition (CVLT-2) Standard Form . Rey Complex Figure Test (RCFT) . [facility] (WCST) . Controlled Oral Word Association Test (COWAT) . Trail Making Test A and  B . [place] Naming Test (BNT) . [place] Diagnostic Aphasia Examination (BDAE): Complex Ideational Material Subtest . Beck Depression Inventory - Second edition (BDI-II) . Generalized Anxiety Disorder - 7 item screener (GAD-7)  Test Results: Note: Standardized scores  are presented only for use by appropriately trained professionals and to allow for any future test-retest comparison. These scores should not be interpreted without consideration of all the information that is contained in the rest of the report. The most recent standardization samples from the test publisher or other sources were used whenever possible to derive standard scores; scores were corrected for age, gender, ethnicity and education when available.   Test Scores:  Test Name Raw Score Standardized Score Descriptor  TOPF 41/70 SS= 98 Average  WAIS-IV Subtests     Similarities 24/36 ss= 9 Average  Information 12/26 ss= 9 Average  Block Design 16/66 ss= 4 Impaired  Matrix Reasoning 11/26 ss= 6 Low average  Arithmetic 8/22 ss= 5 Borderline  Symbol Search 29/60 ss= 9 Average  Coding 62/70 ss= 9 Average  Digit Span 31/48 ss= 11 Average  WAIS-IV Index Scores     Verbal Comprehension  SS= 95 Average  Perceptual Reasoning  SS= 71 Borderline  Working Memory  SS= 89 Low average  Processing Speed  SS= 94 Average  Full Scale IQ (8 subtest)  SS= 84 Low average  WMS-IV Subtests     LM I 15/50 ss= 5 Borderline  LM II 6/50 ss= 3 Impaired  LM II Recognition 21/30 Cum %: 10-16   CVLT-II Scores     Trial 1 3/16 Z= -2.5 Impaired  Trial 5 9/16 Z= -2 Impaired  Trials 1-5 total 38/80 T= 32 Borderline  SD Free Recall 5/16 Z= -3 Severely impaired  SD Cued Recall 5/16 Z= -3.5 Severely impaired  LD Free Recall 3/16 Z= -4 Severely impaired  LD Cued Recall 4/16 Z= -4 Severely impaired  Recognition Discriminability 15/16 hits 8 false positives Z= -2 Impaired  Forced Choice Recognition 16/16  WNL  RCFT     Copy 7/36 <1 %ile Severely impaired  3' Recall .5/36 T= <20 Severely impaired  30' Recall .5/36 T= <20 Severely impaired  Recognition 19/24 T= 38 Low average  WCST     Total Errors 40 T= 25 Impaired  Perseverative Responses 27 T= 21 Severely impaired  Perseverative Errors 22 T= 23 Severely  impaired  Conceptual Level Responses 13 T= 24 Severely impaired  Categories Completed 0 <1%ile Severely impaired  Trials to Complete 1st Category 65 <1%ile Severely impaired  Failure to Maintain Set 1    COWAT-FAS 38 T= 44 Average  COWAT-Animals 14 T= 35 Borderline   Trail Making Test A  25" 0 errors T= 55 Average  Trail Making Test B  53" 0 errors T= 55 Average  BNT 55/60 T= 49 Average  BDAE Subtest     Complex Ideational Material 8/12  Impaired  BDI-II 29/63  Severe  GAD-7 6/21  Mild     Description of Test Results:  Embedded performance validity indicators were within normal limits. As such, the patient's current performance on neurocognitive testing is judged to be a relatively accurate representation of her current level of neurocognitive functioning.   Premorbid verbal intellectual abilities were estimated to have been within the average range based on a test of word reading. Consistent with this, current verbal IQ fell within the average range. This is also stable from her last evaluation. Also similar to her last evaluation, there was a statistically  significant difference between her verbal and non-verbal/perceptual IQ, with the latter falling in the borderline range.   Psychomotor processing speed was average (mildly improved from 2015). Auditory attention and working memory were low average (stable). Visual-spatial construction was impaired to severely impaired (stable). Language abilities were variable. Specifically, confrontation naming was average (stable), and semantic verbal fluency was borderline (relatively stable). Auditory comprehension of complex ideational material was impaired. With regard to verbal memory, performances on various tests indicated some possible decline since her last evaluation. Encoding and acquisition of non-contextual information (i.e., word list) was borderline impaired (stable). After a brief interference task, free recall was severely impaired  (stable), and she did not benefit from semantic cueing (severely impaired). After a delay, free recall was severely impaired (stable). Cued recall was severely impaired (stable). Performance on a yes/no recognition task was impaired due to high number of false positive errors; this is the area of performance that was reduced relative to 2015 when recognition memory was intact. On another verbal memory test, encoding and acquisition of contextual auditory information (i.e., short stories) was borderline (stable). After a delay, free recall was impaired (stable). Performance on a yes/no recognition task was below expectation (again representing decline from 2015 in recognition format). With regard to non-verbal memory, delayed free recall of visual information was severely impaired after both a 3 minute and a 30 minute delay; however, poor initial rendering and visual-spatial praxis problems likely contributed. This is further supported by the fact that performance on a yes/no recognition task was within normal limits (low average; relatively stable). Executive functioning was variable. Mental flexibility and set-shifting were average on Trails B (stable). Verbal fluency with phonemic search restrictions was average (stable). Verbal abstract reasoning was average (stable). Non-verbal abstract reasoning was low average (stable). Deductive reasoning and problem solving were severely impaired (reduced from 2015 although performance was impaired then as well).   On a self-report measure of mood, the patient's responses were indicative of clinically significant depression, in a range considered to be relatively severe. Symptoms endorsed included: mild sadness, indecisiveness, worthlessness, irritability; moderate pessimism, self-dislike, self-criticalness, restlessness, increased sleep, increased sleep, concentration difficulty, and fatigue; severe feelings of failure, punishment feelings, loss of energy. She denied  suicidal ideation or intention On a self-report measure of anxiety, the patient endorsed mild generalized anxiety characterized by feeling nervous and irritable nearly every day. She denied excessive worries, inability to control worrying, trouble relaxing, agitation, or fear of something awful happening.    Clinical Impressions: Major neurocognitive disorder, multifactorial etiology (given medical history and reported history of learning difficulties as a child - likely some congenital onset brain dysfunction, but there is evidence to suggest worsening of memory consolidation over time since her last v-p shunt).  On cognitive testing, the patient continues to demonstrate significant VIQ/PIQ split with a clear strength in language abilities and verbal reasoning, and severe deficits in Bankervisual-spatial construction. There are continued deficits demonstrated in memory encoding/retrieval but there is also evidence of increased problems with memory consolidation this time (compared to last evaluation). Executive functioning and attention are within normal limits except for deductive reasoning.  Overall, in terms of change from last evaluation, processing speed has mildly-moderately improved, and memory recognition has moderately to severely declined. The patient's cognitive profile suggests right parietal dysfunction and left medial temporal lobe involvement.  She is reporting some symptoms of depression and anxiety particularly related to situational/family stressors and stress of living with chronic illness that limits her independence. However, overall, mood is  greatly improved relative to last evaluation.   Recommendations/Plan: Based on the findings of the present evaluation, the following recommendations are offered:  1. I highly recommend repeat neuroimaging (MRI if possible) to evaluate for changes, given that testing indicates significant decline in memory consolidation abilities relative to her last  evaluation.  2. Cognitive rehabilitation at [facility] is recommended. This should be focused on strategies to compensating for memory impairment as it relates to her job.  3. I encouraged her to consider seeing a counselor/therapist again to help with managing stress of chronic medical condition and associated limitations on independence. She is amenable to this. I will place a referral to [facility].    Feedback to Patient: Sophia Marks and her mother returned for a feedback appointment on 08/28/2017 to review the results of her neuropsychological evaluation with this provider. 25 minutes face-to-face time was spent reviewing her test results, my impressions and my recommendations as detailed above. A copy of this report will be mailed to the patient per her mother's request.   Total time spent on this patient's case: 130 minutes for neurobehavioral status exam with psychologist (CPT code 16109, [phone] unit); 150 minutes of testing/scoring by psychometrician under psychologist's supervision (CPT codes [phone], [phone] units); 220 minutes for integration of patient data, interpretation of standardized test results and clinical data, clinical decision making, treatment planning and preparation of this report, and interactive feedback with review of results to the patient/family by psychologist (CPT codes [phone], [phone] units).      Thank you for your referral of Sophia Marks. Please feel free to contact me if you have any questions or concerns regarding this report.

## 2017-08-28 ENCOUNTER — Encounter: Payer: Self-pay | Admitting: Psychology

## 2017-08-28 ENCOUNTER — Ambulatory Visit: Payer: Medicaid Other | Admitting: Psychology

## 2017-08-28 DIAGNOSIS — Z982 Presence of cerebrospinal fluid drainage device: Secondary | ICD-10-CM

## 2017-08-28 DIAGNOSIS — G809 Cerebral palsy, unspecified: Secondary | ICD-10-CM

## 2017-08-28 DIAGNOSIS — R413 Other amnesia: Secondary | ICD-10-CM | POA: Diagnosis not present

## 2017-08-28 DIAGNOSIS — G919 Hydrocephalus, unspecified: Secondary | ICD-10-CM

## 2017-08-28 DIAGNOSIS — F4323 Adjustment disorder with mixed anxiety and depressed mood: Secondary | ICD-10-CM

## 2017-08-28 NOTE — Addendum Note (Signed)
Addended by: Othelia PullingBAILAR-HEATH, MARY B on: 08/28/2017 02:34 PM   Modules accepted: Orders

## 2017-08-29 ENCOUNTER — Encounter: Payer: Self-pay | Admitting: Neurology

## 2017-08-30 ENCOUNTER — Telehealth: Payer: Self-pay | Admitting: Neurology

## 2017-08-30 ENCOUNTER — Telehealth: Payer: Self-pay

## 2017-08-30 DIAGNOSIS — R4189 Other symptoms and signs involving cognitive functions and awareness: Secondary | ICD-10-CM

## 2017-08-30 DIAGNOSIS — G919 Hydrocephalus, unspecified: Secondary | ICD-10-CM

## 2017-08-30 DIAGNOSIS — G910 Communicating hydrocephalus: Secondary | ICD-10-CM

## 2017-08-30 NOTE — Telephone Encounter (Signed)
-----   Message from Drema DallasAdam R Jaffe, DO sent at 08/30/2017  8:58 AM EDT ----- Neurocognitive testing suggests cognitive decline.  I would like to order MRI of brain with and without contrast for patient with hydrocephalus and cognitive decline.  She has a follow up appointment with me on 8/15, so I would like it performed prior to that visit if possible.

## 2017-08-30 NOTE — Telephone Encounter (Signed)
Called and spoke with Pt. I gave her the contact information for [facility] in the event she is not contacted by the end of the week.

## 2017-08-30 NOTE — Telephone Encounter (Signed)
Pt has some questions about MRI, pls call her

## 2017-09-01 ENCOUNTER — Telehealth: Payer: Self-pay | Admitting: Neurology

## 2017-09-01 DIAGNOSIS — G919 Hydrocephalus, unspecified: Secondary | ICD-10-CM

## 2017-09-01 DIAGNOSIS — G910 Communicating hydrocephalus: Secondary | ICD-10-CM

## 2017-09-01 NOTE — Telephone Encounter (Signed)
If they won't perform MRI, then CT of head

## 2017-09-01 NOTE — Telephone Encounter (Signed)
[facility] needs order for xray of the head for this pt who is scheduled for an MRI of the brain due to pt having a programmable shunt.

## 2017-09-01 NOTE — Telephone Encounter (Signed)
Called and spoke with Pt, advised her a CT has been ordered instead. She had a few questions about the programming of her shunt, I advised her to contact Dr Venetia MaxonStern with those questions.

## 2017-09-12 ENCOUNTER — Ambulatory Visit
Admission: RE | Admit: 2017-09-12 | Discharge: 2017-09-12 | Disposition: A | Discharge status: Home / Self Care | Payer: Medicaid Other | Source: Ambulatory Visit | Attending: Neurology | Admitting: Neurology

## 2017-09-12 DIAGNOSIS — G910 Communicating hydrocephalus: Secondary | ICD-10-CM

## 2017-09-12 DIAGNOSIS — G919 Hydrocephalus, unspecified: Secondary | ICD-10-CM

## 2017-09-13 ENCOUNTER — Ambulatory Visit: Payer: Medicaid Other | Admitting: Neurology

## 2017-09-14 ENCOUNTER — Encounter

## 2017-09-14 ENCOUNTER — Telehealth: Payer: Self-pay

## 2017-09-14 ENCOUNTER — Ambulatory Visit: Payer: Medicaid Other | Admitting: Neurology

## 2017-09-14 NOTE — Telephone Encounter (Signed)
Called and LMOVM advising CT results

## 2017-09-14 NOTE — Telephone Encounter (Signed)
-----   Message from Drema DallasAdam R Jaffe, DO sent at 09/12/2017  4:16 PM EDT ----- CT of brain is stable with stable shunts and no evidence of hydrocephalus.

## 2017-09-19 ENCOUNTER — Telehealth: Payer: Self-pay | Admitting: Neurology

## 2017-09-19 NOTE — Telephone Encounter (Signed)
Patient called and wanted to ger more information and name on counselors that were discussed in previous appts. Please call her at [phone]. Thanks.

## 2017-09-20 NOTE — Telephone Encounter (Signed)
Unfortunately I don't know any other therapists specifically that I would refer to. If she wants a general recommendation, we can refer to any local counselor, but I know she wanted to see someone who she was sure would be reliable and consistent (which I agree with). That's why I wanted her to go to [facility]. At this point my recommendation would be to get on their waiting list, since her situation isn't urgent. If she would rather try a different therapist, she can check with her insurance as to who is accepting new clients, or [facility] can [facility].  Thanks!

## 2017-09-20 NOTE — Telephone Encounter (Signed)
Called and LMOVM for Pt to return call 

## 2017-09-21 NOTE — Telephone Encounter (Signed)
Called and LMOVM advising Pt, and for her to call if she needs another referral

## 2017-10-18 NOTE — Progress Notes (Signed)
NEUROLOGY FOLLOW UP OFFICE NOTE  Sophia Marks [id number]  HISTORY OF PRESENT ILLNESS: Sophia Marks is a 37 year old right-handed woman with cerebral pals, /congenital hydrocephalus s/p shunt and ocular migraines and history of optic neuritis who follows up for cognitive impairment.  She is accompanied by her sister who supplements history.  UPDATE: She underwent workup for worsening memory deficits. B12 from 08/07/17 was 367.  Thyroid testing was normal.   She recently was started on Zoloft.  Coming to the appointment today, she started having a panic attack.  She started feeling palpitations, hands felt funny. 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  She is scheduled to see a counselor  She underwent neuropsychological testing on 08/15/17, demonstrating major neurocognitive disorder which again revealed deficits in visual-spatial construction, memory encoding/retrieval and increased problems.  However, compared to prior neurocognitive testing in 2015, she now demonstrates moderate to severe decline in memory recognition.  With the exception of deductive reasoning, executive functioning and attention were within normal limits.  Overall profile suggests right parietal and left medial temporal dysfunction.  She had a CT of the head on 09/12/17 to evaluate for recurrence of hydrocephalus, which was personally reviewed, but was stable compared to prior CT from 2016, demonstrating congenital dysplastic features and stable bilateral CSF shunts with no ventriculomegaly.  HISTORY: Ocular Migraines: She has had ocular migraines since age 44.  They present as onset of floaters and squiggly lines in the visual field of either eye.  It is associated with unilateral dull non-throbbing headache (above affected eye with aura).  There is no associated nausea, vomiting, photophobia, phonophobia or unilateral numbness or weakness.  Staring at a computer screen may  aggravate it (she works as a Geologist, engineering).  Laying down for a couple of minutes help relieve it.  The visual aura lasts 2 minutes but the headache lasts 5 to 15 minutes.  They occur sporadically.  She may have months with no migraine but then they may occur once a month for a few months.  Cognitive Deficits: She was diagnosed with hydrocephalus in her teens.Her neurosurgeon is Dr. Maeola Harman.She had a new shunt placed on 11/16/11 after malfunction and disconnect.Her shunt settings are 15mmH2O pressure.Her shunt is working well.She denies headaches or urinary incontinence.There has been no worsening of gait.However, since the shunt malfunction, she and her family have noted mild change in cognition.Specifically, she seems more slow to respond.When asked a question, she will often have a blank stare for a moment before answering.There is no loss of awareness.Sometimes, when she talks, she knows what she wants to say but has word-finding difficulties.She has been told that she will sometimes repeat herself to other people.She does have stress and some anxiety, especially work-related.She sees a Veterinary surgeon without much difference.She works in transcription.She has no problems performing her job.She is able to read without losing train of thought, without difficulty understanding what she read, and without need to re-read.She denies difficulty following along when watching TV or a movie.She underwent neuropsychological testing on 12/23/13, which revealed brain dysfunction in multiple cognitive domains, primarily indicative of posterior right hemisphere dysfunction (non-motor visualspatial processing and organizational tests) and more generalized cortical damage (impairment in auditory memory, semantic fluency and dominant hand fine motor skills). Based on her longstanding history of need for special education as a child suggests this is congenital onset brain  dysfunction. It could not be determined if there has been a  cognitive decline since shunt revision in 2013. Over the past couple of years, her mother feels that her memory has gotten worse.  TSH from 01/18/17 was 2.33.  CT head without contrast from 04/25/14 personally reviewed and demonstarted resolved ventriculomegaly with no new intracranial abnormalities or abnormality of the shunt.  PAST MEDICAL HISTORY: Past Medical History:  Diagnosis Date  . Cerebral palsy (HCC)   . Hydrocephalus   . VP (ventriculoperitoneal) shunt status     MEDICATIONS: Current Outpatient Medications on File Prior to Visit  Medication Sig Dispense Refill  . hydrOXYzine (ATARAX/VISTARIL) 25 MG tablet Take 25 mg by mouth 3 (three) times daily as needed.    Marland Kitchen. levothyroxine (SYNTHROID, LEVOTHROID) 50 MCG tablet Take 50 mcg by mouth daily.    . norethindrone-ethinyl estradiol (JUNEL FE,GILDESS FE,LOESTRIN FE) 1-20 MG-MCG tablet Take 1 tablet by mouth daily.    . norgestrel-ethinyl estradiol (LO/OVRAL,CRYSELLE) 0.3-30 MG-MCG tablet Take 1 tablet by mouth daily.    . sertraline (ZOLOFT) 100 MG tablet Take 100 mg by mouth daily.    . valACYclovir (VALTREX) 500 MG tablet Take 500 mg by mouth as needed. Cold sores     No current facility-administered medications on file prior to visit.     ALLERGIES: No Known Allergies  FAMILY HISTORY: Family History  Problem Relation Age of Onset  . Hypertension Mother   . CVA Paternal Grandmother   . Breast cancer Paternal Grandmother   . Ataxia Neg Hx   . Chorea Neg Hx   . Dementia Neg Hx   . Mental retardation Neg Hx   . Migraines Neg Hx   . Multiple sclerosis Neg Hx   . Neurofibromatosis Neg Hx   . Neuropathy Neg Hx   . Parkinsonism Neg Hx   . Seizures Neg Hx   . Stroke Neg Hx    SOCIAL HISTORY: Social History   Socioeconomic History  . Marital status: Single    Spouse name: Not on file  . Number of children: Not on file  . Years of education: Not on file    . Highest education level: Not on file  Occupational History  . Not on file  Social Needs  . Financial resource strain: Not on file  . Food insecurity:    Worry: Not on file    Inability: Not on file  . Transportation needs:    Medical: Not on file    Non-medical: Not on file  Tobacco Use  . Smoking status: Never Smoker  . Smokeless tobacco: Never Used  Substance and Sexual Activity  . Alcohol use: No    Alcohol/week: 0.0 standard drinks    Comment: rarely  . Drug use: No  . Sexual activity: Never  Lifestyle  . Physical activity:    Days per week: Not on file    Minutes per session: Not on file  . Stress: Not on file  Relationships  . Social connections:    Talks on phone: Not on file    Gets together: Not on file    Attends religious service: Not on file    Active member of club or organization: Not on file    Attends meetings of clubs or organizations: Not on file    Relationship status: Not on file  . Intimate partner violence:    Fear of current or ex partner: Not on file    Emotionally abused: Not on file    Physically abused: Not on file    Forced sexual activity: Not  on file  Other Topics Concern  . Not on file  Social History Narrative  . Not on file    REVIEW OF SYSTEMS: Constitutional: No fevers, chills, or sweats, no generalized fatigue, change in appetite Eyes: No visual changes, double vision, eye pain Ear, nose and throat: No hearing loss, ear pain, nasal congestion, sore throat Cardiovascular: palpitations Respiratory:  No shortness of breath at rest or with exertion, wheezes GastrointestinaI: No nausea, vomiting, diarrhea, abdominal pain, fecal incontinence Genitourinary:  No dysuria, urinary retention or frequency Musculoskeletal:  No neck pain, back pain Integumentary: No rash, pruritus, skin lesions Neurological: as above Psychiatric: anxiety, some depression Endocrine: No palpitations, fatigue, diaphoresis, mood swings, change in appetite,  change in weight, increased thirst Hematologic/Lymphatic:  No purpura, petechiae. Allergic/Immunologic: no itchy/runny eyes, nasal congestion, recent allergic reactions, rashes  PHYSICAL EXAM: Blood pressure 140/90, pulse (!) 110, height 5' (1.524 m), weight 148 lb (67.1 kg), SpO2 99 %. General: No acute distress.  Patient appears well-groomed.   Head:  Normocephalic/atraumatic Eyes:  Fundi examined but not visualized Neck: supple, no paraspinal tenderness, full range of motion Heart:  Regular rate and rhythm Lungs:  Clear to auscultation bilaterally Back: No paraspinal tenderness Neurological Exam: alert and oriented to person, place, and time. Attention span and concentration intact, recent and remote memory intact, fund of knowledge intact.  Speech fluent and not dysarthric, language intact.  CN II-XII intact. Bulk and tone normal, muscle strength 5/5 throughout.  Sensation to light touch  intact.  Deep tendon reflexes 2+ throughout.  Finger to nose testing intact.  Gait normal, Romberg negative.  IMPRESSION: Major neurocognitive disorder.  Repeat neuropsychological testing demonstrated worsening of memory consolidation.  However, CT head reveals no hydrocephalus.    PLAN: 1.  I will contact Dr. Alinda Dooms to discuss further her impression of the test results.  If there is a strong suspicion of an acute pathological process (such as an autoimmune disease), then I would proceed with further testing.  She is unable to get an MRI due to her shunt valve.  If further testing necessary, I would check with Dr. Venetia Maxon to see if it would be possible to get an MRI.  Otherwise, I would pursue other testing. 2.  She also reported palpitations.  Pulse was 110.  She reports anxiety.  Recommend following up with PCP.  25 minutes spent face to face with patient, over 50% spent discussing differential diagnosis and management.  Shon Millet, DO  CC: Jarrett Soho, PA-C

## 2017-10-19 ENCOUNTER — Encounter: Payer: Self-pay | Admitting: Neurology

## 2017-10-19 ENCOUNTER — Ambulatory Visit (INDEPENDENT_AMBULATORY_CARE_PROVIDER_SITE_OTHER): Payer: Medicaid Other | Admitting: Neurology

## 2017-10-19 ENCOUNTER — Other Ambulatory Visit: Payer: Self-pay

## 2017-10-19 VITALS — BP 140/90 | HR 110 | Ht 60.0 in | Wt 148.0 lb

## 2017-10-19 DIAGNOSIS — F4323 Adjustment disorder with mixed anxiety and depressed mood: Secondary | ICD-10-CM | POA: Diagnosis not present

## 2017-10-19 DIAGNOSIS — F0390 Unspecified dementia without behavioral disturbance: Secondary | ICD-10-CM

## 2017-10-19 DIAGNOSIS — Z982 Presence of cerebrospinal fluid drainage device: Secondary | ICD-10-CM | POA: Diagnosis not present

## 2017-10-19 DIAGNOSIS — G919 Hydrocephalus, unspecified: Secondary | ICD-10-CM

## 2017-10-19 DIAGNOSIS — F0150 Vascular dementia without behavioral disturbance: Secondary | ICD-10-CM | POA: Diagnosis not present

## 2017-10-19 NOTE — Patient Instructions (Addendum)
1.I will talk with Dr. Alinda DoomsBailar to further discuss test results. 3.  Further recommendations pending discussion with these doctors.

## 2017-10-31 ENCOUNTER — Telehealth: Payer: Self-pay | Admitting: Neurology

## 2017-10-31 NOTE — Telephone Encounter (Signed)
Patient is calling in wanting to know if Dr.Jaffe or the Nurse talked with her Neurosurgeon Dr. Maeola Harman about her different symptoms. This was discussed at the previous appt. Mention of [employer]. Please call her back at [phone]. Thanks!

## 2017-10-31 NOTE — Telephone Encounter (Signed)
Please advise 

## 2017-11-01 NOTE — Telephone Encounter (Signed)
Still waiting to hear back from Dr. Venetia Maxon

## 2017-11-01 NOTE — Telephone Encounter (Signed)
Called and advised Pt that Dr Everlena Cooper has left a message for Dr Venetia Maxon, and once we hear from Dr Venetia Maxon, we will call her back

## 2017-11-08 ENCOUNTER — Telehealth: Payer: Self-pay | Admitting: Neurology

## 2017-11-08 DIAGNOSIS — R413 Other amnesia: Secondary | ICD-10-CM

## 2017-11-08 NOTE — Telephone Encounter (Signed)
-----   Message from Drema Dallas, DO sent at 11/08/2017  1:59 PM EDT ----- Please let Ms. Tomassetti know that I spoke with Dr. Venetia Maxon.  She is able to have an MRI of the brain, however she will need to see Dr. Venetia Maxon the same day after the MRI to have the shunt reprogrammed.  So, we can schedule MRI of brain with and without contrast for memory deficits, but she will have to go to Dr. Fredrich Birks office afterwards to have the shunt reprogrammed.  When the MRI is scheduled, she should contact Dr. Fredrich Birks office to make an appointment for that same day after the MRI.

## 2017-11-08 NOTE — Telephone Encounter (Signed)
Patient made aware.  MR order entered. They will contact the patient to schedule.

## 2017-11-17 ENCOUNTER — Telehealth: Payer: Self-pay | Admitting: Neurology

## 2017-11-17 NOTE — Telephone Encounter (Signed)
Called and spoke with Pt. She was concerned no one had contacted her about MRI yet, advised her she can call and schedule, confirmed [facility]'s #

## 2017-11-17 NOTE — Telephone Encounter (Signed)
Patient called regarding her Referral to neurosurgery? She said she was told Dr. Everlena Cooper was going to talk to the Neurosurgeon? Please Call. Thanks

## 2017-11-20 ENCOUNTER — Telehealth: Payer: Self-pay | Admitting: Neurology

## 2017-11-20 NOTE — Telephone Encounter (Signed)
Patient called and left a vm that she called her doctor per Dr.Jaffe to schedule an MRI and he is currently booked out until December. She didn't know if she needed to go ahead and schedule an appt for December or if she needed to go somewhere else. Please call her back at [phone]. Thanks!

## 2017-11-22 ENCOUNTER — Telehealth: Payer: Self-pay | Admitting: Neurology

## 2017-11-22 NOTE — Telephone Encounter (Signed)
Patient called regarding Dr. Venetia Maxon being booked out and her having an MRI scheduled for 11/30/17 that she was unaware of. She thought 01/30/18? She is unsure of how she is going to have the MRI on 10/31 when Dr. Venetia Maxon is booked out and she will need him for a procedure prior to the MRI? Please call. Thanks

## 2017-11-22 NOTE — Telephone Encounter (Signed)
Patient is calling again and left a voicemail that she was getting calls from [facility] about an MRI and she said that she doesn't have an appt with Dr.Stern's office to reprogram her shunt until December. She wants to know what to do. Please call her back. Thanks!

## 2017-11-22 NOTE — Telephone Encounter (Signed)
Morrie Sheldon will call and have patient schedule the MRI for December since she does not see Dr. Venetia Maxon until then.

## 2017-11-22 NOTE — Telephone Encounter (Signed)
Left message for patient to set up the MRI in December.

## 2017-11-23 ENCOUNTER — Ambulatory Visit: Payer: Medicaid Other | Admitting: Neurology

## 2017-11-30 ENCOUNTER — Ambulatory Visit
Admission: RE | Admit: 2017-11-30 | Discharge: 2017-11-30 | Disposition: A | Discharge status: Home / Self Care | Payer: Medicaid Other | Source: Ambulatory Visit | Attending: Neurology | Admitting: Neurology

## 2017-11-30 DIAGNOSIS — R413 Other amnesia: Secondary | ICD-10-CM

## 2017-11-30 MED ORDER — GADOBENATE DIMEGLUMINE 529 MG/ML IV SOLN
14.0000 mL | Freq: Once | INTRAVENOUS | Status: AC | PRN
  Administered 2017-11-30: 14 mL via INTRAVENOUS

## 2017-12-05 ENCOUNTER — Telehealth: Payer: Self-pay | Admitting: Neurology

## 2017-12-05 NOTE — Telephone Encounter (Signed)
Patient wants to get her test results for the scan she had done on 11-30-17

## 2017-12-05 NOTE — Telephone Encounter (Signed)
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  There is nothing new or concerning that would be causing cognitive changes.  The worsening memory problems possibly is related to depression.

## 2017-12-06 NOTE — Telephone Encounter (Signed)
Called and spoke with Pt, advised her of MRI results. 

## 2018-01-17 ENCOUNTER — Telehealth: Payer: Self-pay | Admitting: Neurology

## 2018-01-17 NOTE — Telephone Encounter (Signed)
Patient's mother is calling in regards to paperwork that was recently filled out. She has some questions with wording in it. Please call her back at [phone]. Thanks!

## 2018-01-17 NOTE — Telephone Encounter (Signed)
Called and spoke with Sophia Marks. She was questioning dates of shunt indicated in medical records. I advised her since we were not the provider that inserted the shunt or revised it, social security will be asking those providers for that type of specifics

## 2018-03-19 ENCOUNTER — Telehealth: Payer: Self-pay | Admitting: Neurology

## 2018-03-19 NOTE — Telephone Encounter (Signed)
Called and advised Pt per DR. Jaffe, she should first contact her PCP

## 2018-03-19 NOTE — Telephone Encounter (Signed)
Patient is experiencing vertigo and wants to know if there is anything she can take for it since she has to wait until June for an appt. Please call her back. Thanks!

## 2018-04-02 ENCOUNTER — Encounter: Payer: Self-pay | Admitting: Internal Medicine

## 2018-04-02 ENCOUNTER — Ambulatory Visit: Payer: Medicaid Other | Admitting: Internal Medicine

## 2018-04-02 VITALS — BP 142/96 | HR 112 | Ht 61.0 in | Wt 156.4 lb

## 2018-04-02 DIAGNOSIS — E039 Hypothyroidism, unspecified: Secondary | ICD-10-CM

## 2018-04-02 LAB — T4, FREE: Free T4: 0.78 ng/dL (ref 0.60–1.60)

## 2018-04-02 LAB — TSH: TSH: 2.5 u[IU]/mL (ref 0.35–4.50)

## 2018-04-02 NOTE — Patient Instructions (Signed)

## 2018-04-02 NOTE — Progress Notes (Signed)
Name: Sophia Marks  MRN/ DOB: [id number], 12/07/1980    Age/ Sex: 38 y.o., female    PCP: Sophia Soho, PA-C   Reason for Endocrinology Evaluation: Hypothyroidism     Date of Initial Endocrinology Evaluation: 04/02/2018     HPI: Sophia Marks is a 38 y.o. female with a past medical history of cerebral palsy, hydrocephalus (S/P VP shunts) cognitive impairment and hypothyroidism. The patient presented for initial endocrinology clinic visit on 04/02/2018 for consultative assistance with her hypothyroidism.   Pt was diagnosed with hypothyroidism in 2017 due to c/o weight gain, heat intolerance and excessive sweating . She was started on Lt-4 replacement at the time, initially at 25 mcg daily which was increased to 50 mcg daily due to a TSH of 5.79 uIU/mL .    She continues with weight gain, she has cold/heat intolerance. She has alternating diarrhea with constipation. She also endorses depression and anxiety.   No prior exposure to radiation.  She is not on biotin.    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 Cerebral palsy (HCC)   . Hydrocephalus (HCC)   . VP (ventriculoperitoneal) shunt status    Past Surgical History:  Past Surgical History:  Procedure Laterality Date  . heal surgery    . VENTRICULOPERITONEAL SHUNT  11/16/2011   Procedure: SHUNT INSERTION VENTRICULAR-PERITONEAL;  Surgeon: Sophia Harman, MD;  Location: [facility];  Service: Neurosurgery;  Laterality: Right;  Ventricular Peritoneal Shunt Insertion/Revision with Laparoscopic Abdominal Approach      Social History:  reports that she has never smoked. She has never used smokeless tobacco. She reports that she does not drink alcohol or use drugs.  Family History: family history includes Breast cancer in her paternal grandmother; CVA in her paternal grandmother; Hypertension in her mother.   HOME MEDICATIONS: Allergies as of 04/02/2018   No  Known Allergies     Medication List       Accurate as of April 02, 2018  2:19 PM. Always use your most recent med list.        hydrOXYzine 25 MG tablet Commonly known as:  ATARAX/VISTARIL Take 25 mg by mouth 3 (three) times daily as needed.   levothyroxine 50 MCG tablet Commonly known as:  SYNTHROID, LEVOTHROID Take 50 mcg by mouth daily.   norethindrone-ethinyl estradiol 1-20 MG-MCG tablet Commonly known as:  JUNEL FE,GILDESS FE,LOESTRIN FE Take 1 tablet by mouth daily.   norgestrel-ethinyl estradiol 0.3-30 MG-MCG tablet Commonly known as:  LO/OVRAL,CRYSELLE Take 1 tablet by mouth daily.   sertraline 50 MG tablet Commonly known as:  ZOLOFT TAKE 1/2 TABLET BY MOUTH ONCE A DAY   valACYclovir 500 MG tablet Commonly known as:  VALTREX Take 500 mg by mouth as needed. Cold sores         REVIEW OF SYSTEMS: A comprehensive ROS was conducted with the patient and is negative except as per HPI and below:  Review of Systems  Constitutional: Positive for malaise/fatigue. Negative for weight loss.  HENT: Negative for congestion and sore throat.   Respiratory: Negative for cough and shortness of breath.   Cardiovascular: Positive for chest pain and palpitations.  Gastrointestinal: Positive for constipation and diarrhea.  Genitourinary: Positive for frequency.  Neurological: Positive for tingling.  Endo/Heme/Allergies: Positive for polydipsia.  Psychiatric/Behavioral: Positive for depression. The patient is nervous/anxious.        OBJECTIVE:  VS: BP (!) 142/96 (BP  Location: Left Arm, Patient Position: Sitting, Cuff Size: Normal)   Pulse (!) 112   Ht 5\' 1"  (1.549 m)   Wt 156 lb 6.4 oz (70.9 kg)   SpO2 96%   BMI 29.55 kg/m    Wt Readings from Last 3 Encounters:  04/02/18 156 lb 6.4 oz (70.9 kg)  10/19/17 148 lb (67.1 kg)  08/07/17 148 lb (67.1 kg)     EXAM: General: Pt appears well and is in NAD  Hydration: Well-hydrated with moist mucous membranes and good skin  turgor  Eyes: External eye exam normal without stare, lid lag or exophthalmos.  EOM intact.   Ears, Nose, Throat: Hearing: Grossly intact bilaterally Dental: Good dentition  Throat: Clear without mass, erythema or exudate  Neck: General: Supple without adenopathy. Thyroid: Thyroid size normal.  No goiter or nodules appreciated. No thyroid bruit.  Lungs: Clear with good BS bilat with no rales, rhonchi, or wheezes  Heart: Auscultation: RRR.  Abdomen: Normoactive bowel sounds, soft, nontender, without masses or organomegaly palpable  Extremities:  BL LE: No pretibial edema normal ROM and strength.  Skin: Hair: Texture and amount normal with gender appropriate distribution Skin Inspection: No rashes. Skin Palpation: Skin temperature, texture, and thickness normal to palpation  Neuro: Cranial nerves: II - XII grossly intact  Motor: Normal strength throughout DTRs: 3+ and symmetric in UE without delay in relaxation phase  Mental Status: Judgment, insight: Intact Orientation: Oriented to time, place, and person Mood and affect: No depression, anxiety, or agitation     DATA REVIEWED: 11/16/2017  TSH 3.15 uIU/mL    Results for Sophia, Marks (MRN [id number]) as of 04/03/2018 08:30  Ref. Range 04/02/2018 14:45  TSH Latest Ref Range: 0.35 - 4.50 uIU/mL 2.50  T4,Free(Direct) Latest Ref Range: 0.60 - 1.60 ng/dL 4.69    ASSESSMENT/PLAN/RECOMMENDATIONS:   1. Hypothyroidism :  - Patient with multiple non-specific symptoms, that are NOT attributed to her thyroid.  - I have explained to her that symptoms of fatigue, weight gain could be caused by a number of other differentials and the only way to tell if this is related to her thyroid or not is by having normal thyroid function tests.  - Reassurance provided.  - Pt educated extensively on the correct way to take levothyroxine (first thing in the morning with water, 30 minutes before eating or taking other medications). - Pt encouraged to double  dose the following day if she were to miss a dose given long half-life of levothyroxine. - Repeat TFT's today continue to be normal  Medications : Levothyroxine 50 mcg daily  Annual TFT's through her PCP    2. Weight gain   - This is multifactorial as the patient admits to dietary indiscretions and sedentary lifestyle. She is also on celexa which could contribute to weight gain. Pt encouraged to restart weight watchers. \   F/U PRN    Signed electronically by: Lyndle Herrlich, MD  [facility] Endocrinology  [facility] [address] [place], [place] [address] Phone: [phone] FAX: [phone]   CC: Sophia Soho, PA-C [address] [place] [address] Phone: [phone] Fax: [phone]   Return to Endocrinology clinic as below: No future appointments.

## 2018-04-03 ENCOUNTER — Encounter: Payer: Self-pay | Admitting: Internal Medicine

## 2018-07-23 ENCOUNTER — Encounter

## 2018-07-23 ENCOUNTER — Ambulatory Visit: Payer: Medicaid Other | Admitting: Neurology

## 2018-10-31 ENCOUNTER — Other Ambulatory Visit: Payer: Self-pay | Admitting: Obstetrics & Gynecology

## 2018-10-31 DIAGNOSIS — Z1231 Encounter for screening mammogram for malignant neoplasm of breast: Secondary | ICD-10-CM

## 2018-11-19 ENCOUNTER — Other Ambulatory Visit: Payer: Self-pay

## 2018-11-19 DIAGNOSIS — Z20822 Contact with and (suspected) exposure to covid-19: Secondary | ICD-10-CM

## 2018-11-21 LAB — NOVEL CORONAVIRUS, NAA: SARS-CoV-2, NAA: NOT DETECTED

## 2019-01-01 ENCOUNTER — Other Ambulatory Visit: Payer: Self-pay

## 2019-01-01 ENCOUNTER — Ambulatory Visit
Admission: RE | Admit: 2019-01-01 | Discharge: 2019-01-01 | Disposition: A | Discharge status: Home / Self Care | Payer: Medicaid Other | Source: Ambulatory Visit | Attending: Obstetrics & Gynecology | Admitting: Obstetrics & Gynecology

## 2019-01-01 DIAGNOSIS — Z1231 Encounter for screening mammogram for malignant neoplasm of breast: Secondary | ICD-10-CM

## 2019-01-02 ENCOUNTER — Other Ambulatory Visit: Payer: Self-pay | Admitting: Obstetrics & Gynecology

## 2019-01-02 DIAGNOSIS — R928 Other abnormal and inconclusive findings on diagnostic imaging of breast: Secondary | ICD-10-CM

## 2019-01-09 ENCOUNTER — Ambulatory Visit
Admission: RE | Admit: 2019-01-09 | Discharge: 2019-01-09 | Disposition: A | Discharge status: Home / Self Care | Payer: Medicaid Other | Source: Ambulatory Visit | Attending: Obstetrics & Gynecology | Admitting: Obstetrics & Gynecology

## 2019-01-09 ENCOUNTER — Other Ambulatory Visit: Payer: Self-pay | Admitting: Obstetrics & Gynecology

## 2019-01-09 ENCOUNTER — Other Ambulatory Visit: Payer: Self-pay

## 2019-01-09 DIAGNOSIS — R928 Other abnormal and inconclusive findings on diagnostic imaging of breast: Secondary | ICD-10-CM

## 2019-01-11 ENCOUNTER — Other Ambulatory Visit: Payer: Medicaid Other

## 2019-02-15 ENCOUNTER — Institutional Professional Consult (permissible substitution): Payer: Medicaid Other | Admitting: Pulmonary Disease

## 2019-03-21 ENCOUNTER — Institutional Professional Consult (permissible substitution): Payer: Medicaid Other | Admitting: Pulmonary Disease

## 2019-04-05 ENCOUNTER — Ambulatory Visit: Payer: Medicaid Other | Admitting: Plastic Surgery

## 2019-04-05 ENCOUNTER — Other Ambulatory Visit: Payer: Self-pay

## 2019-04-05 ENCOUNTER — Encounter: Payer: Self-pay | Admitting: Plastic Surgery

## 2019-04-05 DIAGNOSIS — N62 Hypertrophy of breast: Secondary | ICD-10-CM | POA: Diagnosis not present

## 2019-04-05 DIAGNOSIS — G8929 Other chronic pain: Secondary | ICD-10-CM

## 2019-04-05 DIAGNOSIS — M542 Cervicalgia: Secondary | ICD-10-CM | POA: Diagnosis not present

## 2019-04-05 DIAGNOSIS — M546 Pain in thoracic spine: Secondary | ICD-10-CM

## 2019-04-05 DIAGNOSIS — M549 Dorsalgia, unspecified: Secondary | ICD-10-CM

## 2019-04-05 HISTORY — DX: Dorsalgia, unspecified: M54.9

## 2019-04-05 HISTORY — DX: Hypertrophy of breast: N62

## 2019-04-05 HISTORY — DX: Cervicalgia: M54.2

## 2019-04-05 NOTE — Progress Notes (Addendum)
Patient ID: Sophia Marks, female    DOB: 01/12/1981, 39 y.o.   MRN: [id number]   Chief Complaint  Patient presents with  . Breast Problem    Mammary Hyperplasia: The patient is a 39 y.o. female with a history of mammary hyperplasia for several years.  She has extremely large breasts causing symptoms that include the following: Back pain in the upper and lower back, including neck pain. She pulls or pins her bra straps to provide better lift and relief of the pressure and pain. She notices relief by holding her breast up manually.  Her shoulder straps cause grooves and pain and pressure that requires padding for relief. Pain medication is sometimes required with motrin and tylenol.  Activities that are hindered by enlarged breasts include: exercise and running.  Due to her cerebral palsy she has impairment in her daily activities.  She is incredibly functional for her diagnosis and holds a job for medical transcription.  Her breasts are extremely large and fairly symmetric with left side slightly longer and fuller than the right.  She has hyperpigmentation of the inframammary area on both sides.  The sternal to nipple distance on the right is 27 cm and the left is 28 cm.  The IMF distance is 11 cm.  She is 5 feet 2 inches tall and weighs 144 pounds.  Preoperative bra size = 38 DDD cup.  She would like to be a C cup the estimated excess breast tissue to be removed at the time of surgery = 350 grams on the left and 350 grams on the right.  Mammogram history: She had a mammogram in December 2020 which led to an ultrasound.  It was recommended that the ultrasound be repeated in 6 months which would be June.  The abnormality was acoustic enhancement in the 12:00 location of the left breast 8 cm from the nipple consistent with a fibroadenoma.  The patient has cerebral palsy and has a VP shunt in place for hydrocephalus.  She has had shunt revisions, heel cord lengthening and wisdom teeth removed.  She also  has hypothyroidism.  She is not a smoker and she is currently working on weight loss with weight watchers.  The notes indicate cognitive brain dysfunction most likely related to her diagnosis.   Review of Systems  Constitutional: Positive for activity change. Negative for appetite change.  Eyes: Negative.   Respiratory: Negative.   Cardiovascular: Negative.   Gastrointestinal: Negative.   Endocrine: Negative.   Genitourinary: Negative.   Musculoskeletal: Positive for back pain and neck pain.  Neurological: Negative.   Hematological: Negative.     Past Medical History:  Diagnosis Date  . Cerebral palsy ([place])   . Hydrocephalus ([place])   . VP (ventriculoperitoneal) shunt status     Past Surgical History:  Procedure Laterality Date  . heal surgery    . VENTRICULOPERITONEAL SHUNT  11/16/2011   Procedure: SHUNT INSERTION VENTRICULAR-PERITONEAL;  Surgeon: Sophia Levine, MD;  Location: [facility];  Service: Neurosurgery;  Laterality: Right;  Ventricular Peritoneal Shunt Insertion/Revision with Laparoscopic Abdominal Approach      Current Outpatient Medications:  .  citalopram (CELEXA) 20 MG tablet, Take 20 mg by mouth daily. 1/2 tab once daily, Disp: , Rfl:  .  hydrOXYzine (ATARAX/VISTARIL) 25 MG tablet, Take 25 mg by mouth 3 (three) times daily as needed., Disp: , Rfl:  .  levothyroxine (SYNTHROID, LEVOTHROID) 50 MCG tablet, Take 50 mcg by mouth daily., Disp: , Rfl:  .  valACYclovir (VALTREX) 500 MG tablet, Take 500 mg by mouth as needed. Cold sores, Disp: , Rfl:    Objective:   Vitals:   04/05/19 0853  BP: (!) 145/85  Pulse: 87  Temp: (!) 97.3 F (36.3 C)  SpO2: 98%    Physical Exam Vitals and nursing note reviewed.  Constitutional:      Appearance: Normal appearance.  HENT:     Head: Normocephalic and atraumatic.  Eyes:     Extraocular Movements: Extraocular movements intact.  Cardiovascular:     Rate and Rhythm: Normal rate.     Pulses: Normal pulses.  Pulmonary:       Effort: Pulmonary effort is normal. No respiratory distress.  Abdominal:     General: Abdomen is flat. There is no distension.  Skin:    General: Skin is warm.     Capillary Refill: Capillary refill takes less than 2 seconds.  Neurological:     General: No focal deficit present.     Mental Status: She is alert and oriented to person, place, and time.  Psychiatric:        Mood and Affect: Mood normal.        Behavior: Behavior normal.        Thought Content: Thought content normal.     Assessment & Plan:  Chronic bilateral thoracic back pain  Neck pain  Symptomatic mammary hypertrophy  Highly recommend breast reduction bilaterally.  I think a little bit of improvement would go a long way with this patient.  Will need to get the repeat ultrasound prior to surgery for the left breast.  Pictures were obtained of the patient and placed in the chart with the patient's or guardian's permission.  Sophia Form, DO   The 21st Century Cures Act was signed into law in 2016 which includes the topic of electronic health records.  This provides immediate access to information in MyChart.  This includes consultation notes, operative notes, office notes, lab results and pathology reports.  If you have any questions about what you read please let us know at your next visit or call us at the office.  We are right here with you.

## 2019-04-10 ENCOUNTER — Encounter: Payer: Self-pay | Admitting: Plastic Surgery

## 2019-06-10 NOTE — Progress Notes (Signed)
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  She is accompanied by her father who supplements history.  UPDATE: To assess worsening memory, she had an MRI of the brain with and without contrast on 11/30/2017, which was personally reviewed and showed bilateral shunts and chronic periventricular leukomalacia with volume loss in the bilateral parietal lobes with compensatory enlargement of the atria but no hydrocephalus acute abnormality.  She reports increased somnolence and sleeps a lot.  Sometimes she has trouble falling asleep.  Memory problems are worse.  She has increased trouble recalling.  She is still transcribing.  She is able to rewind to review what was said.   No visual problems.  Occasionally she has an ocular migraine. She also reports occasional numbness in hands while in bed.   HISTORY: Ocular Migraines: She has had ocular migraines since age 32. They present as onset of floaters and squiggly lines in the visual field of either eye. It is associated with unilateral dull non-throbbing headache (above affected eye with aura). There is no associated nausea, vomiting, photophobia, phonophobia or unilateral numbness or weakness. Staring at a computer screen may aggravate it (she works as a Pensions consultant). Laying down for a couple of minutes help relieve it. The visual aura lasts 2 minutes but the headache lasts 5 to 15 minutes. They occur sporadically. She may have months with no migraine but then they may occur once a month for a few months.  Cognitive Deficits: She was diagnosed with hydrocephalus in her teens.Her neurosurgeon is Dr. Erline Levine.She had a new shunt placed on 11/16/11 after malfunction and disconnect.Her  shunt settings are 81mmH2O pressure.Her shunt is working well.She denies headaches or urinary incontinence.There has been no worsening of gait.However, since the shunt malfunction, she and her family have noted mild change in cognition.Specifically, she seems more slow to respond.When asked a question, she will often have a blank stare for a moment before answering.There is no loss of awareness.Sometimes, when she talks, she knows what she wants to say but has word-finding difficulties.She has been told that she will sometimes repeat herself to other people.She does have stress and some anxiety, especially work-related.She sees a Social worker without much difference.She works in transcription.She has no problems performing her job.She is able to read without losing train of thought, without difficulty understanding what she read, and without need to re-read.She denies difficulty following along when watching TV or a movie.She underwent neuropsychological testing on 12/23/13, which revealed brain dysfunction in multiple cognitive domains, primarily indicative of posterior right hemisphere dysfunction (non-motor visualspatial processing and organizational tests) and more generalized cortical damage (impairment in auditory memory, semantic fluency and dominant hand fine motor skills). 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 It could not be determined if there has been a cognitive decline since shunt revision in 2013.Over the past couple of years, her mother feels that her memory has gotten worse.  TSH from 01/18/17 was 2.33.  In 2019, she endorsed worsening memory deficits.  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  She also endorsed panic attacks.  B12 from 08/07/17 was 367.  Thyroid testing was normal.  She underwent neuropsychological  testing on 08/15/17,  demonstrating major neurocognitive disorder which again revealed deficits in visual-spatial construction, memory encoding/retrieval and increased problems.  However, compared to prior neurocognitive testing in 2015, she now demonstrates moderate to severe decline in memory recognition.  With the exception of deductive reasoning, executive functioning and attention were within normal limits.  Overall profile suggests right parietal and left medial temporal dysfunction.  She had a CT of the head on 09/12/17 to evaluate for recurrence of hydrocephalus, which was personally reviewed, but was stable compared to prior CT from 04/25/2014, demonstrating congenital dysplastic features and stable bilateral CSF shunts with no ventriculomegaly.  PAST MEDICAL HISTORY: Past Medical History:  Diagnosis Date  . Cerebral palsy ([facility])   . Hydrocephalus ([facility])   . VP (ventriculoperitoneal) shunt status     MEDICATIONS: Current Outpatient Medications on File Prior to Visit  Medication Sig Dispense Refill  . citalopram (CELEXA) 20 MG tablet Take 20 mg by mouth daily. 1/2 tab once daily    . hydrOXYzine (ATARAX/VISTARIL) 25 MG tablet Take 25 mg by mouth 3 (three) times daily as needed.    Marland Kitchen levothyroxine (SYNTHROID, LEVOTHROID) 50 MCG tablet Take 50 mcg by mouth daily.    . valACYclovir (VALTREX) 500 MG tablet Take 500 mg by mouth as needed. Cold sores     No current facility-administered medications on file prior to visit.    ALLERGIES: No Known Allergies  FAMILY HISTORY: Family History  Problem Relation Age of Onset  . Hypertension Mother   . CVA Paternal Grandmother   . Breast cancer Paternal Grandmother   . Ataxia Neg Hx   . Chorea Neg Hx   . Dementia Neg Hx   . Mental retardation Neg Hx   . Migraines Neg Hx   . Multiple sclerosis Neg Hx   . Neurofibromatosis Neg Hx   . Neuropathy Neg Hx   . Parkinsonism Neg Hx   . Seizures Neg Hx   . Stroke Neg Hx    SOCIAL HISTORY: Social History    Socioeconomic History  . Marital status: Single    Spouse name: Not on file  . Number of children: Not on file  . Years of education: Not on file  . Highest education level: Not on file  Occupational History  . Not on file  Tobacco Use  . Smoking status: Never Smoker  . Smokeless tobacco: Never Used  Substance and Sexual Activity  . Alcohol use: No    Alcohol/week: 0.0 standard drinks    Comment: rarely  . Drug use: No  . Sexual activity: Never  Other Topics Concern  . Not on file  Social History Narrative  . Not on file   Social Determinants of Health   Financial Resource Strain:   . Difficulty of Paying Living Expenses:   Food Insecurity:   . Worried About Programme researcher, broadcasting/film/video in the Last Year:   . Barista in the Last Year:   Transportation Needs:   . Freight forwarder (Medical):   Marland Kitchen Lack of Transportation (Non-Medical):   Physical Activity:   . Days of Exercise per Week:   . Minutes of Exercise per Session:   Stress:   . Feeling of Stress :   Social Connections:   . Frequency of Communication with Friends and Family:   . Frequency of Social Gatherings with Friends and Family:   . Attends Religious Services:   . Active Member of Clubs or Organizations:   . Attends Club  or Organization Meetings:   Marland Kitchen Marital Status:   Intimate Partner Violence:   . Fear of Current or Ex-Partner:   . Emotionally Abused:   Marland Kitchen Physically Abused:   . Sexually Abused:     PHYSICAL EXAM: Blood pressure 140/90, pulse 96, resp. rate 20, height 5\' 2"  (1.575 m), weight 143 lb (64.9 kg), SpO2 97 %. General: No acute distress.  Patient appears well-groomed.   Head:  Normocephalic/atraumatic Eyes:  Fundi examined but not visualized Neck: supple, no paraspinal tenderness, full range of motion Heart:  Regular rate and rhythm Lungs:  Clear to auscultation bilaterally Back: No paraspinal tenderness Neurological Exam: alert and oriented to person, place, and time. Attention  span and concentration intact, recent and remote memory intact, fund of knowledge intact.  Speech fluent and not dysarthric, language intact.   [employer] Mental Exam 06/11/2019  Weekday Correct 0  Current year 1  What state are we in? 1  Amount spent 0  Amount left 0  # of Animals 2  5 objects recall 0  Number series 1  Hour markers 1  Time correct 0  Placed X in triangle correctly 1  Largest Figure 1  Name of female 0  Date back to work 0  Type of work 0  State she lived in 1  Total score 9   CN II-XII intact. Bulk and tone normal, muscle strength 5/5 throughout.  Sensation to light touch, temperature and vibration intact.  Deep tendon reflexes 3+ throughout, toes downgoing.  Finger to nose testing intact.  Spastic gait, Romberg negative.  IMPRESSION: Major neurocognitive disorder Hydrocephalus s/p VP shunt.  No hydrocephalus noted on imaging. Ocular migraine Daytime somnolence.  Consider OSA, which may contribute to memory problems as well. Probable carpal tunnel syndrome (right greater than left)  PLAN: 1.  Sleep study 2.  Neuropsychological testing 3.  Bilateral wrist splints 4.  Follow up after testing.  07-15-1995, DO  CC: Shon Millet, PA-C

## 2019-06-11 ENCOUNTER — Encounter: Payer: Self-pay | Admitting: Neurology

## 2019-06-11 ENCOUNTER — Other Ambulatory Visit: Payer: Self-pay

## 2019-06-11 ENCOUNTER — Ambulatory Visit (INDEPENDENT_AMBULATORY_CARE_PROVIDER_SITE_OTHER): Payer: Medicaid Other | Admitting: Neurology

## 2019-06-11 VITALS — BP 140/90 | HR 96 | Resp 20 | Ht 62.0 in | Wt 143.0 lb

## 2019-06-11 DIAGNOSIS — G5603 Carpal tunnel syndrome, bilateral upper limbs: Secondary | ICD-10-CM

## 2019-06-11 DIAGNOSIS — G919 Hydrocephalus, unspecified: Secondary | ICD-10-CM | POA: Diagnosis not present

## 2019-06-11 DIAGNOSIS — F0150 Vascular dementia without behavioral disturbance: Secondary | ICD-10-CM

## 2019-06-11 DIAGNOSIS — G4700 Insomnia, unspecified: Secondary | ICD-10-CM

## 2019-06-11 DIAGNOSIS — F0390 Unspecified dementia without behavioral disturbance: Secondary | ICD-10-CM

## 2019-06-11 DIAGNOSIS — Z982 Presence of cerebrospinal fluid drainage device: Secondary | ICD-10-CM | POA: Diagnosis not present

## 2019-06-11 NOTE — Patient Instructions (Addendum)
1.  We will repeat neurocognitive testing 2.  Try wearing wrist splints at night when you go to bed to see if the hand numbness improves. 3.  Follow up after testing. 4. Will order a Sleep study.

## 2019-06-18 ENCOUNTER — Ambulatory Visit: Payer: Medicaid Other | Admitting: Psychology

## 2019-06-18 ENCOUNTER — Encounter: Payer: Self-pay | Admitting: Psychology

## 2019-06-18 ENCOUNTER — Other Ambulatory Visit: Payer: Self-pay

## 2019-06-18 ENCOUNTER — Ambulatory Visit (INDEPENDENT_AMBULATORY_CARE_PROVIDER_SITE_OTHER): Payer: Medicaid Other | Admitting: Psychology

## 2019-06-18 DIAGNOSIS — F331 Major depressive disorder, recurrent, moderate: Secondary | ICD-10-CM

## 2019-06-18 DIAGNOSIS — G919 Hydrocephalus, unspecified: Secondary | ICD-10-CM

## 2019-06-18 DIAGNOSIS — F411 Generalized anxiety disorder: Secondary | ICD-10-CM

## 2019-06-18 DIAGNOSIS — N393 Stress incontinence (female) (male): Secondary | ICD-10-CM | POA: Insufficient documentation

## 2019-06-18 DIAGNOSIS — R4189 Other symptoms and signs involving cognitive functions and awareness: Secondary | ICD-10-CM

## 2019-06-18 DIAGNOSIS — N951 Menopausal and female climacteric states: Secondary | ICD-10-CM | POA: Insufficient documentation

## 2019-06-18 DIAGNOSIS — G3184 Mild cognitive impairment, so stated: Secondary | ICD-10-CM

## 2019-06-18 DIAGNOSIS — G4700 Insomnia, unspecified: Secondary | ICD-10-CM | POA: Insufficient documentation

## 2019-06-18 DIAGNOSIS — F0670 Mild neurocognitive disorder due to known physiological condition without behavioral disturbance: Secondary | ICD-10-CM

## 2019-06-18 NOTE — Progress Notes (Addendum)
NEUROPSYCHOLOGICAL EVALUATION [place] Department of Neurology  Date of Evaluation: Jun 18, 2019  Reason for Referral:   Sophia Marks is a 39 y.o. right-handed Caucasian female referred by Shon MilletAdam Jaffe, D.O., to characterize her current cognitive functioning and assist with diagnostic clarity and treatment planning in the context of subjective cognitive decline and a history of birth prematurity, congenital hydrocephalus, and cerebral palsy.   Assessment and Plan:   Clinical Impression(s): Sophia Marks's pattern of performance is suggestive of primary deficits surrounding encoding (i.e., learning) and retrieval aspects of verbal and visual memory, along with relative weaknesses across cognitive flexibility, verbal fluency, and visuospatial/visuoconstructional tasks. Despite some mild variability at times, performances were largely appropriate across domains of processing speed, attention/concentration, other aspects of executive functioning, receptive language, and confrontation naming.  Relative to her previous evaluation in 2019, performance declines were seen across verbal retrieval (i.e., across list learning and phonemic fluency tasks) and the learning of unstructured verbal information. Some improvements were seen across executive functioning, while scores across all other domains (including visual and contextual verbal memory) were generally stable. 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 However, the major neurocognitive disorder designation requires cognitive dysfunction to significantly hamper performance of instrumental activities of daily living (ADLs) in her day-to-day life. During the current interview, Sophia Marks largely denied ADL dysfunction. As such, the major designation seems inappropriate. 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   As stated above, cognitive dysfunction likely has several causes. Congenital onset brain dysfunction, including her history of special education services in early academic settings and potentially related to her history of birth prematurity, certainly increases the risk for cognitive dysfunction throughout life. Her history of hydrocephalus can also create lasting cognitive deficits and individuals with a history of this condition commonly exhibit worse visuospatial/nonverbal abilities as seen across current and prior evaluations. While not universal across cerebral palsy diagnoses, cognitive deficits can additionally accompany this condition, again with often worse visual relative to verbal abilities. It is important to note that across mood-related questionnaires, she reported severe symptoms of depression (slightly worse than in 2019) and mild levels of anxiety. She also reported moderate ongoing sleep dysfunction. Ongoing mood and sleep concerns can certainly influence cognitive functioning, especially across processing speed, attention/concentration, executive functioning, and encoding/retrieval aspects of memory. As much of her profile was stable outside of mild declines in verbal retrieval, it is possible that recent performance declines are related most directly to these psychosocial factors. As such, acute treatment should likely focus on improving/alleviating these concerns. However, her overall pattern of largely stable cognitive weaknesses are believed to be due to her medical history as described above.  Recommendations: A combination of medication and psychotherapy has been shown to be most effective at treating symptoms of anxiety and depression. As such, Sophia Marks is encouraged to speak with her prescribing physician regarding medication adjustments to optimally manage these symptoms.   Likewise, Sophia Marks is encouraged to consider  engaging in short-term psychotherapy to address symptoms of psychiatric distress. She would benefit from an active and collaborative therapeutic environment, rather than one purely supportive in nature. Recommended treatment modalities include Cognitive Behavioral Therapy (CBT) or Acceptance and Commitment Therapy (ACT). I will place a referral with [facility] should she wish to pursue this form of treatment.   Sophia Marks  reported significant symptoms of insomnia and should discuss medication options with her PCP. She also reported non-restful sleep and instances where she has awoken gasping for air despite not feeling that these symptoms reflected panic attacks. As such, she may have some red flags for sleep apnea and a laboratory sleep study should be considered. I will defer to the judgment of her medical team in this regard.   Performance across neurocognitive testing is not a strong predictor of an individual's safety operating a motor vehicle. However, current scores would not preclude her from operating a motor vehicle. Should her family wish to pursue a formalized driving evaluation, they would be encouraged to contact [employer] in [place], [place] at [phone]. Another option would be through [facility]; however, the latter would likely require a referral from a medical doctor. [facility] can be reached directly at [phone].   When learning new information, she would benefit from information being broken up into small, manageable pieces.  may also find it helpful to articulate the material in her own words and in a context to promote encoding at the onset of a new task. This material may need to be repeated multiple times to promote encoding.  Memory can be improved using internal strategies such as rehearsal, repetition, chunking, mnemonics, association, and imagery. External strategies such as written notes in a consistently used memory journal,  visual and nonverbal auditory cues such as a calendar on the refrigerator or appointments with alarm, such as on a cell phone, can also help maximize recall.    To address problems with processing speed, she may wish to consider:   -Ensuring that she is alerted when essential material or instructions are being presented   -Adjusting the speed at which new information is presented   -Allowing for more time in comprehending, processing, and responding in conversation  To address problems with fluctuating attention, she may wish to consider:   -Avoiding external distractions when needing to concentrate   -Limiting exposure to fast paced environments with multiple sensory demands   -Writing down complicated information and using checklists   -Attempting and completing one task at a time (i.e., no multi-tasking)   -Verbalizing aloud each step of a task to maintain focus   -Reducing the amount of information considered at one time  Review of Records:   Sophia Marks completed an initial comprehensive neuropsychological evaluation on 12/23/2013 (Dr. Leonides Cave). Results revealed brain dysfunction in multiple cognitive domains, primarily indicative of posterior right hemisphere dysfunction (non-motor visuospatial processing and organizational tests) and more generalized cortical damage (impairment in auditory memory, semantic fluency and dominant hand fine motor skills). She completed a repeat evaluation on 08/28/2017 Sophia Marks, Psy.D.). At that time, results revealed a significant VIQ/PIQ split with a clear strength in language abilities and verbal reasoning, and severe deficits in Banker. There are continued deficits demonstrated in memory encoding/retrieval but there is also evidence of increased problems with memory consolidation this time (compared to last evaluation). Executive functioning and attention are within normal limits except for deductive reasoning. Overall, in terms  of change from last evaluation, processing speed was said to have mildly-moderately improved while memory recognition had moderately to severely declined. Her cognitive profile was said to suggest right parietal dysfunction and left medial temporal lobe involvement. Ultimately, she was diagnosed with a "Major neurocognitive disorder, multifactorial etiology (given medical history and reported history of learning difficulties as a child - likely some congenital onset brain dysfunction, but there  is evidence to suggest worsening of memory consolidation over time since her last v-p shunt)."  Sophia Marks was seen by [facility] Shon Millet, D.O.) on 06/11/2019 for an evaluation of worsening memory. Briefly, Sophia Marks was diagnosed with hydrocephalusin her teens.She had a new shunt placed on 11/16/2011 after malfunction and disconnect.Since the shunt malfunction, she and her family reported changes in her cognition.Specifically, she seemed more slow to respond.When asked a question, she would often have a blank stare for a moment before answering.No loss of awareness was reported.Sometimes, when she talks, she reported knowing what she wants to say but has word-finding difficulties.She has also been told that she will sometimes repeat herself to other people.Based upon prior neuropsychological testing and her longstanding history of need for special education as a child suggested congenital onset brain dysfunction. Over the past couple of years, her mother reported feeling as though Sophia Marks's memory has worsened.In 2019, Sophia Marks endorsed worsening memory deficits, as well as the presence of panic attacks. 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 CT of the head on 09/12/17 to evaluate for recurrence of hydrocephalus was stable compared to prior CT from 04/25/2014, demonstrating congenital dysplastic features and stable bilateral CSF shunts with no  ventriculomegaly. MRI of the brain with and without contrast on 11/30/2017 showed bilateral shunts and chronic periventricular leukomalacia with volume loss in the bilateral parietal lobes with compensatory enlargement of the atria but no hydrocephalus acute abnormality. Performance on a brief cognitive screening instrument (SLUMS) was 9/30. Ultimately, Sophia Marks was referred for a repeat neuropsychological evaluation to characterize her cognitive abilities and assess report cognitive changes since her previous evaluation.   Past Medical History:  Diagnosis Date  . Acquired hypothyroidism 12/22/2015  . Back pain 04/05/2019  . Cerebral palsy   . Female stress incontinence   . Generalized anxiety disorder   . Hydrocephalus    Congenital  . Insomnia   . Laryngopharyngeal reflux (LPR) 03/08/2016  . Major depressive disorder   . Neck pain 04/05/2019  . 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 Symptomatic mammary hypertrophy 04/05/2019  . VP (ventriculoperitoneal) shunt status     Past Surgical History:  Procedure Laterality Date  . heal surgery    . VENTRICULOPERITONEAL SHUNT  11/16/2011   Procedure: SHUNT INSERTION VENTRICULAR-PERITONEAL;  Surgeon: Maeola Harman, MD;  Location: [facility];  Service: Neurosurgery;  Laterality: Right;  Ventricular Peritoneal Shunt Insertion/Revision with Laparoscopic Abdominal Approach    Current Outpatient Medications:  .  citalopram (CELEXA) 20 MG tablet, Take 20 mg by mouth daily. 1/2 tab once daily, Disp: , Rfl:  .  hydrOXYzine (ATARAX/VISTARIL) 25 MG tablet, Take 25 mg by mouth 3 (three) times daily as needed., Disp: , Rfl:  .  levothyroxine (SYNTHROID, LEVOTHROID) 50 MCG tablet, Take 50 mcg by mouth daily., Disp: , Rfl:  .  valACYclovir (VALTREX) 500 MG tablet, Take 500 mg by mouth as needed. Cold sores, Disp: , Rfl:   Clinical Interview:   Cognitive Symptoms: Decreased short-term memory: Endorsed. She described longstanding  difficulties with information retrieval, noting that it feels like her "brain wipes clean" things that she has previously learned. She further noted trouble remembering the details of previous conversations and misplacing objects. Deficits were first noticed following her shunt revision in 2013. However, she reported worsening cognitive decline since her previous neuropsychological evaluation in 2019. While she presented alone during the current evaluation, she stated that her  family and friends would likely stress their perception of a decline over the past two years as well.  Decreased long-term memory: Denied. Decreased attention/concentration: Endorsed. However, deficits were said to be very subtle and possibly have exhibited a minimal decline.  Reduced processing speed: Endorsed. Difficulties with executive functions: Denied. Difficulties with emotion regulation: Denied. Difficulties with receptive language: Endorsed. She emphasized occasional difficulties comprehending what others are saying to her.  Difficulties with word finding: Endorsed. Decreased visuoperceptual ability: Denied.  Difficulties completing ADLs: Largely denied. She currently lives alone. She did acknowledge rare instances where she will forget if she has already taken her medications earlier in the day. Additionally, she does not currently drive. However, she reported having her driver's license and passing both written and on-the-road driving evaluations in the past. She stated that she does not currently drive largely due to her parents/family being somewhat "overprotective."   Additional Medical History: History of traumatic brain injury/concussion: Denied. History of stroke: Denied. History of seizure activity: Denied. History of known exposure to toxins: Denied. Symptoms of chronic pain: Denied. Experience of frequent headaches/migraines: Endorsed. Headaches were said to occur somewhat often, but are generally treated  well with OTC medications.  Frequent instances of dizziness/vertigo: Denied.  Sensory changes: She wears glasses when looking at computer screens but otherwise denied visual acuity concerns. She likewise denied difficulties/changes surrounding other senses (i.e., hearing, taste, or smell).  Balance/coordination difficulties: Largely denied. She did acknowledge tripping up some steps, but denied a history of recent falls.  Other motor difficulties: Denied.  Other medical conditions: Sophia Marks was born premature and additionally has a history of cerebral palsy.   Sleep History: Estimated hours obtained each night: Unclear. She described her sleep as "horrible."  Difficulties falling asleep: Endorsed. She reported significant symptoms of insomnia at times, causing her to lay awake for several hours before being able to fall asleep. She also reported having an abnormal sleep schedule where she will ultimately fall asleep in the very early morning and sleep until the early afternoon.  Difficulties staying asleep: Endorsed. She noted commonly waking up in the middle of the night, is awake for a few hours at a time, and then eventually is able to fall back asleep.  Feels rested and refreshed upon awakening: Denied. She emphasized "never" feeling rested throughout the day.   History of snoring: Unknown. History of waking up gasping for air: Endorsed. She added that these have occurred somewhat often and are not due to panic symptoms.  Witnessed breath cessation while asleep: Denied.  History of vivid dreaming: Denied. Excessive movement while asleep: Denied. Instances of acting out her dreams: Denied.  Psychiatric/Behavioral Health History: Depression: Endorsed. She described a history of depression, often manifesting in her having a short fuse and being easily angered. However, following her 2019 evaluation, she was placed on mood-related medications. These were said to be very helpful and her mood  was said to have improved notably since that time. She previously engaged in psychotherapy in the past. However, this was not described as helpful, most likely due to the fact that she had three different therapists in a short period of time due to them moving on. Current or remote suicidal ideation, intent, or plan was denied.  Anxiety: Endorsed. However, current symptoms were seemingly negligible due to the effectiveness of current medications.  Mania: Denied. Trauma History: Denied. Visual/auditory hallucinations: Denied. Delusional thoughts: Denied.  Tobacco: Denied. Alcohol: She reported occasional alcohol consumption and denied a history of problematic alcohol  abuse or dependence.  Recreational drugs: Denied. Caffeine: 1-2 cups of coffee in the morning.   Family History: Problem Relation Age of Onset  . Hypertension Mother   . Depression Maternal Grandfather   . CVA Paternal Grandmother   . Breast cancer Paternal Grandmother   . Ataxia Neg Hx   . Chorea Neg Hx   . Dementia Neg Hx   . Mental retardation Neg Hx   . Migraines Neg Hx   . Multiple sclerosis Neg Hx   . Neurofibromatosis Neg Hx   . Neuropathy Neg Hx   . Parkinsonism Neg Hx   . Seizures Neg Hx   . Stroke Neg Hx    This information was confirmed by Sophia Marks.  Academic/Vocational History: Highest level of educational attainment: 16 years. She graduated from high school, earned an Editor, commissioning degree in music entertainment and a Water quality scientist degree in [place]. She described herself as an average (C/D) student in academic settings. Math, especially mental calculations, and reading comprehension were noted as relative weaknesses.  History of developmental delay: Endorsed. She reported receiving extra tutoring services and being enrolled in special education courses throughout elementary and junior high school capacities.  History of grade repetition: Denied. History of LD/ADHD: Denied.  Employment: She currently works as  a Building control surveyor. She denied any significant job-related difficulties or stressors at the present time.   Evaluation Results:   Behavioral Observations: Sophia Marks was unaccompanied, arrived to her appointment on time, and was appropriately dressed and groomed. She appeared alert and oriented. She ambulated with an abnormal gait. However, she did not exhibit any frank balance instability, did not require external stabilization, and ambulated at an appropriate pace. Gross motor functioning appeared intact upon informal observation and no abnormal movements (e.g., tremors) were noted. Her affect was generally relaxed and positive, but did range appropriately given the subject being discussed during the clinical interview or the task at hand during testing procedures. Spontaneous speech was fluent and word finding difficulties were not observed during the clinical interview. Thought processes were coherent, organized, and normal in content. Insight into her cognitive difficulties appeared largely appropriate. During testing, sustained attention was appropriate. Task engagement was adequate and she persisted when challenged. Overall, Ms. Kister was cooperative with the clinical interview and subsequent testing procedures.   Adequacy of Effort: The validity of neuropsychological testing is limited by the extent to which the individual being tested may be assumed to have exerted adequate effort during testing. Ms. Daponte expressed her intention to perform to the best of her abilities and exhibited adequate task engagement and persistence. Scores across stand-alone and embedded performance validity measures were mildly variable but largely within expectation. As such, the results of the current evaluation are believed to be a valid representation of Ms. Hietpas's current cognitive functioning.  Test Results: Ms. Constancio was fully oriented at the time of the current evaluation.  Intellectual  abilities based upon educational and vocational attainment were estimated to be in the average range. Premorbid abilities were estimated to be within the above average range based upon a single-word reading test. Prior short-form IQ testing revealed her IQ to her in the average normative range.    Processing speed was variable, ranging from the exceptionally low to average normative ranges, but was largely within expectation. Basic attention was above average. More complex attention (e.g., working memory) was average to above average. Executive functioning was below average to above average, but largely within expectation. A relative weakness was noted  across cognitive flexibility.  Assessed receptive language abilities were average. Likewise, Ms. Mccutcheon did not exhibit any difficulties comprehending task instructions and answered all questions asked of her appropriately. Assessed expressive language (e.g., verbal fluency and confrontation naming) was variable. Phonemic fluency was well below average to below average, semantic fluency was below average, and confrontation naming was average.     Assessed visuospatial/visuoconstructional abilities were variable, but largely below expectation. Outside of an above average score across a visual discrimination task, scores were generally well below average to below average. Points were lost on her drawing of a clock due to incorrect hand placement. Points were lost on her copy of a complex figure due to poor spatial representations.    Learning (i.e., encoding) of novel verbal and visual information was exceptionally low to well below average. Spontaneous delayed recall (i.e., retrieval) of previously learned information was exceptionally low. Retention rates were 29% across a story learning task, 14% across a list learning task, and 33% across a shape learning task. Performance across recognition tasks was appropriate across a story learning task, but poor across  list and shape learning tasks, suggesting limited evidence for information consolidation.   Results of emotional screening instruments suggested that recent symptoms of generalized anxiety were in the mild range, while symptoms of depression were within the severe range. A screening instrument assessing recent sleep quality suggested the presence of moderate sleep dysfunction.  Tables of Scores:   Note: This summary of test scores accompanies the interpretive report and should not be considered in isolation without reference to the appropriate sections in the text. Descriptors are based on appropriate normative data and may be adjusted based on clinical judgment. The terms "impaired" and "within normal limits (WNL)" are used when a more specific level of functioning cannot be determined. Descriptors refer to the current evaluation only.        Effort Testing:    [facility]   July 2019 Current    ACS Word Choice: --- --- --- Within Expectation  WAIS-IV Reliable Digit Span: --- --- --- Within Expectation  CVLT-III Forced Choice Recognition: --- --- --- Within Expectation  BVMT-R Retention Percentage: --- --- --- Below Expectation  D-KEFS Color Word Effort Index: --- --- --- Within Expectation        Orientation:       Raw Score Raw Score Percentile   NAB Orientation, Form 1 --- 29/29 --- ---        Intellectual Functioning:             Standard Score Standard Score Percentile   Test of Premorbid Functioning: 98 113 81 Above Average        Memory:            Wechsler Memory Scale (WMS-IV):                       Raw Score Raw Score (Scaled Score) Percentile     Logical Memory I 15/50 14/50 (5) 5 Well Below Average    Logical Memory II 6/50 4/50 (2) <1 Exceptionally Low    Logical Memory Recognition 21/30 23/30 26-50 Average        [place] Verbal Learning Test (CVLT-III), Standard Form: Raw Score Raw Score (Scaled/Standard Score) Percentile     Total Trials 1-5 38/80 26/80 (66) 1  Exceptionally Low    List B --- 5/16 (10) 50 Average    Short-Delay Free Recall 5/16 2/16 (2) <1 Exceptionally Low    Short-Delay  Cued Recall 5/16 2/16 (1) <1 Exceptionally Low    Long Delay Free Recall 3/16 1/16 (1) <1 Exceptionally Low    Long Delay Cued Recall 4/16 1/16 (1) <1 Exceptionally Low      Recognition Hits 15/16 15/16 (9) 37 Average      False Positive Errors 8 15 (2) <1 Exceptionally Low        Brief Visuospatial Memory Test (BVMT-R), Form 1: Raw Score Raw Score (T Score) Percentile     Total Trials 1-3 --- 7/36 (20) <1 Exceptionally Low    Delayed Recall --- 1/12 (20) <1 Exceptionally Low    Recognition Discrimination Index --- 2 <1 Exceptionally Low      Recognition Hits --- 4/6 3-5 Well Below Average      False Positive Errors --- 2 <1 Exceptionally Low               Rey-Osterrieth Complex Figure Test (RCFT): Raw Score (T Score) Raw Score (T Score) Percentile     Immediate Recall 0.5/36 (<20) --- --- ---    Delayed Recall 0.5/36 (<20) --- --- ---    Recognition Total Correct 19/24 (38) --- --- ---        Attention/Executive Function:            Trail Making Test (TMT): Raw Score Raw Score (T Score) Percentile     Part A 25 secs.,  0 errors 25 secs.,  0 errors (45) 31 Average    Part B 53 secs,  0 errors 69 secs.,  2 errors (41) 18 Below Average          Scaled Score Scaled Score Percentile   WAIS-IV Coding: 9 9 37 Average         Scaled Score Scaled Score Percentile   WAIS-IV Digit Span: 11 11 63 Average    Forward --- 12 75 Above Average    Backward --- 12 75 Above Average    Sequencing --- 8 25 Average        D-KEFS Color-Word Interference Test: Raw Score Raw Score (Scaled Score) Percentile     Color Naming --- 45 secs. (3) 1 Exceptionally Low    Word Reading --- 28 secs. (6) 9 Below Average    Inhibition --- 65 secs. (8) 25 Average      Total Errors --- 0 errors (12) 75 Above Average    Inhibition/Switching --- 105 secs. (2) <1 Exceptionally Low        Total Errors --- 5 errors (7) 16 Below Average        D-KEFS Verbal Fluency Test: Raw Score Raw Score (Scaled Score) Percentile     Letter Total Correct --- 26 (6) 9 Below Average    Category Total Correct --- 29 (6) 9 Below Average    Category Switching Total Correct --- 12 (8) 25 Average    Category Switching Accuracy --- 11 (9) 37 Average      Total Set Loss Errors --- 0 (13) 84 Above Average      Total Repetition Errors --- 0 (12) 75 Above Average        D-KEFS 20 Questions Test: Scaled Score Scaled Score Percentile     Total Weighted Achievement Score --- 7 16 Below Average    Initial Abstraction Score --- 10 50 Average        [place]consin Card Sorting Test [facility]): Raw Score Raw Score Percentile     Categories (trials) 0 (64) 3 (64) >16 Within Normal Limits  Total Errors 40 18 25 Average    Perseverative Errors Well Below Average    Non-Perseverative Errors --- 5 46 Average    Failure to Maintain Set 1 0 --- ---        Language:            Verbal Fluency Test: Raw Score Raw Score (T Score) Percentile     Phonemic Fluency (FAS) 38 26 (32) 4 Well Below Average    Animal Fluency 14 17 (37) 9 Below Average         NAB Language Module, Form 1: T Score T Score Percentile     Auditory Comprehension --- 53 62 Average    Naming --- 31/31 (53) 62 Average        Visuospatial/Visuoconstruction:       Raw Score Raw Score Percentile   Clock Drawing: --- 7/10 --- Within Normal Limits  RCFT Copy: 7/36 --- --- ---        NAB Spatial Module, Form 1: T Score T Score Percentile     Visual Discrimination --- 57 75 Above Average    Figure Drawing Copy --- 42 21 Below Average    Figure Drawing Immediate Recall --- 19 <1 Exceptionally Low         Scaled Score Scaled Score Percentile   WAIS-IV Block Design: Well Below Average  WAIS-IV Matrix Reasoning: 6 9 37 Average  WAIS-IV Visual Puzzles: --- 5 5 Well Below Average        Mood and Personality:       Raw Score Raw  Score Percentile   Beck Depression Inventory - II: 29 33 --- Severe  PROMIS Anxiety Questionnaire: --- 19 --- Mild        Additional Questionnaires:       Raw Score Raw Score Percentile   PROMIS Sleep Disturbance Questionnaire: --- 32 --- Moderate   Informed Consent and Coding/Compliance:   Ms. Hughson was provided with a verbal description of the nature and purpose of the present neuropsychological evaluation. Also reviewed were the foreseeable risks and/or discomforts and benefits of the procedure, limits of confidentiality, and mandatory reporting requirements of this provider. The patient was given the opportunity to ask questions and receive answers about the evaluation. Oral consent to participate was provided by the patient.   This evaluation was conducted by Newman Nickels, Ph.D., licensed clinical neuropsychologist. Ms. Higginbotham completed a comprehensive clinical interview with Dr. Milbert Coulter, billed as one unit [phone], and 175 minutes of cognitive testing and scoring, billed as one unit [phone] and five additional units [id number]. Psychometrist Wallace Keller, B.S., assisted Dr. Milbert Coulter with test administration and scoring procedures. As a separate and discrete service, Dr. Milbert Coulter spent a total of 180 minutes in interpretation and report writing billed as one unit [phone] and two units [id number].

## 2019-06-18 NOTE — Progress Notes (Signed)
   Psychometrician Note   Cognitive testing was administered to [name] by Sophia Marks, B.S. (psychometrist) under the supervision of Dr. Newman Nickels, Ph.D., licensed psychologist on 06/18/19. Sophia Marks did not appear overtly distressed by the testing session per behavioral observation or responses across self-report questionnaires. Dr. Newman Nickels, Ph.D. checked in with Sophia Marks as needed to manage any distress related to testing procedures (if applicable). Rest breaks were offered.    The battery of tests administered was selected by Dr. Newman Nickels, Ph.D. with consideration to Sophia Marks's current level of functioning, the nature of her symptoms, emotional and behavioral responses during interview, level of literacy, observed level of motivation/effort, and the nature of the referral question. This battery was communicated to the psychometrist. Communication between Dr. Newman Nickels, Ph.D. and the psychometrist was ongoing throughout the evaluation and Dr. Newman Nickels, Ph.D. was immediately accessible at all times. Dr. Newman Nickels, Ph.D. provided supervision to the psychometrist on the date of this service to the extent necessary to assure the quality of all services provided.    Sophia Marks will return within approximately 1-2 weeks for an interactive feedback session with Dr. Milbert Coulter at which time her test performances, clinical impressions, and treatment recommendations will be reviewed in detail. Sophia Marks understands she can contact our office should she require our assistance before this time.  A total of 175 minutes of billable time were spent face-to-face with Sophia Marks by the psychometrist. This includes both test administration and scoring time. Billing for these services is reflected in the clinical report generated by Dr. Newman Nickels, Ph.D..  This note reflects time spent with the psychometrician and does not include test scores or any clinical  interpretations made by Dr. Milbert Coulter. The full report will follow in a separate note.

## 2019-06-19 ENCOUNTER — Encounter: Payer: Self-pay | Admitting: Psychology

## 2019-06-19 DIAGNOSIS — F0670 Mild neurocognitive disorder due to known physiological condition without behavioral disturbance: Secondary | ICD-10-CM

## 2019-06-19 DIAGNOSIS — G3184 Mild cognitive impairment, so stated: Secondary | ICD-10-CM

## 2019-06-19 DIAGNOSIS — F329 Major depressive disorder, single episode, unspecified: Secondary | ICD-10-CM | POA: Insufficient documentation

## 2019-06-19 DIAGNOSIS — F411 Generalized anxiety disorder: Secondary | ICD-10-CM | POA: Insufficient documentation

## 2019-06-19 HISTORY — DX: Mild cognitive impairment, so stated: G31.84

## 2019-06-19 HISTORY — DX: Mild neurocognitive disorder due to known physiological condition without behavioral disturbance: F06.70

## 2019-06-25 ENCOUNTER — Other Ambulatory Visit: Payer: Self-pay

## 2019-06-25 ENCOUNTER — Ambulatory Visit (INDEPENDENT_AMBULATORY_CARE_PROVIDER_SITE_OTHER): Payer: Medicaid Other | Admitting: Psychology

## 2019-06-25 DIAGNOSIS — G919 Hydrocephalus, unspecified: Secondary | ICD-10-CM

## 2019-06-25 DIAGNOSIS — G4700 Insomnia, unspecified: Secondary | ICD-10-CM

## 2019-06-25 DIAGNOSIS — F411 Generalized anxiety disorder: Secondary | ICD-10-CM | POA: Diagnosis not present

## 2019-06-25 DIAGNOSIS — F331 Major depressive disorder, recurrent, moderate: Secondary | ICD-10-CM

## 2019-06-25 DIAGNOSIS — G809 Cerebral palsy, unspecified: Secondary | ICD-10-CM

## 2019-06-25 NOTE — Progress Notes (Signed)
   Neuropsychology Feedback Session Sophia Marks. [facility] [place] Department of Neurology  Reason for Referral:   Sophia Marks a 38 y.o. right-handed Caucasian female referred by Shon Millet, D.O.,to characterize hercurrent cognitive functioning and assist with diagnostic clarity and treatment planning in the context of subjective cognitive decline and a history of birth prematurity, congenital hydrocephalus, and cerebral palsy.   Feedback:   Sophia Marks completed a comprehensive neuropsychological evaluation on 06/18/2019. Please refer to that encounter for the full report and recommendations. Briefly, results suggested primary deficits surrounding encoding (i.e., learning) and retrieval aspects of verbal and visual memory, along with relative weaknesses across cognitive flexibility, verbal fluency, and visuospatial/visuoconstructional tasks. Relative to her previous evaluation in 2019, performance declines were seen across verbal retrieval (i.e., across list learning and phonemic fluency tasks) and the learning of unstructured verbal information. Some improvements were seen across executive functioning, while scores across all other domains (including visual and contextual verbal memory) were generally stable. As much of her profile was stable outside of mild declines in verbal retrieval, it is possible that recent performance declines are related most directly to ongoing mood and sleep dysfunction. As such, acute treatment should likely focus on improving/alleviating these concerns. However, her overall pattern of largely stable cognitive weaknesses are believed to be due to her prior medical history.  Sophia Marks was accompanied by her mother during the current telephone call. Content of the current session focused on the results of her neuropsychological evaluation. Sophia Marks and her mother were given the opportunity to ask questions and their questions were answered. They were encouraged  to reach out should additional questions arise. A copy of her report was mailed at the conclusion of the visit.      20 minutes were spent conducting the current feedback session with Sophia Marks, billed as one unit [id number].

## 2019-07-15 ENCOUNTER — Other Ambulatory Visit: Payer: Self-pay | Admitting: Obstetrics & Gynecology

## 2019-07-15 ENCOUNTER — Other Ambulatory Visit: Payer: Self-pay

## 2019-07-15 ENCOUNTER — Ambulatory Visit
Admission: RE | Admit: 2019-07-15 | Discharge: 2019-07-15 | Disposition: A | Discharge status: Home / Self Care | Payer: Medicaid Other | Source: Ambulatory Visit | Attending: Obstetrics & Gynecology | Admitting: Obstetrics & Gynecology

## 2019-07-15 DIAGNOSIS — N63 Unspecified lump in unspecified breast: Secondary | ICD-10-CM

## 2019-07-15 DIAGNOSIS — R928 Other abnormal and inconclusive findings on diagnostic imaging of breast: Secondary | ICD-10-CM

## 2019-07-17 ENCOUNTER — Telehealth: Payer: Self-pay | Admitting: Plastic Surgery

## 2019-07-17 NOTE — Telephone Encounter (Signed)
Spoke with patient on 04/22/2019 regarding denial and the reason Medicaid denied the breast reduction surgery request. Patient has called back to inform us that she is ready to schedule, but I reminded her that the request was denied. I advised the patient on 3/22 that she will have the option for an appeal, and to follow the instructions on her denial letter. At this point, she is past the 30 day appeal window. The patient continued to ask what I can do to get the surgery covered, but I informed her that, unless we have new information to submit to Medicaid, the request is likely to be denied as a duplicate or failure to meet the guidelines. The surgery was initially denied because there is no history of breast related illness, such as breast cancer. Her best option right now is to go to Physical Therapy for evaluation and treatment of neck/back pain related to the weight of her breast. We will put in a PT referral and the patient will need to follow up in our office once she has completed PT. Patient has discussed this with her mother and expressed understanding.

## 2019-08-02 ENCOUNTER — Encounter: Payer: Self-pay | Admitting: Physical Therapy

## 2019-08-02 ENCOUNTER — Other Ambulatory Visit: Payer: Self-pay

## 2019-08-02 ENCOUNTER — Ambulatory Visit: Payer: Medicaid Other | Attending: Plastic Surgery | Admitting: Physical Therapy

## 2019-08-02 DIAGNOSIS — R2689 Other abnormalities of gait and mobility: Secondary | ICD-10-CM | POA: Diagnosis present

## 2019-08-02 DIAGNOSIS — M546 Pain in thoracic spine: Secondary | ICD-10-CM | POA: Diagnosis not present

## 2019-08-02 NOTE — Patient Instructions (Signed)
Access Code: [email or web address]: [email or web address]: 07/02/2021Prepared by: Victorino Dike PaaExercises  Supine Lower Trunk Rotation - 2 x daily - 7 x weekly - 2 sets - 10 reps - 10 hold  Supine Single Knee to Chest Stretch - 2 x daily - 7 x weekly - 1 sets - 3 reps - 30 hold  Sidelying Thoracic Rotation with Open Book - 2 x daily - 7 x weekly - 2 sets - 10 reps - 10 hold  Corner Pec Major Stretch - 2 x daily - 7 x weekly - 1 sets - 5 reps - 30 hold

## 2019-08-02 NOTE — Therapy (Signed)
[facility] [address] [place], [place], [address] Phone: [phone]   Fax:  [phone]  Physical Therapy Evaluation  Patient Details  Name: Sophia Marks MRN: [id number] Date of Birth: January 21, 1981 Referring Provider (PT): Dr. Foster Simpson    Encounter Date: 08/02/2019   PT End of Session - 08/02/19 0901    Visit Number 1    Number of Visits 8    Date for PT Re-Evaluation 09/27/19    Authorization Type [employer] Medicaid    PT Start Time 0746    PT Stop Time 0832    PT Time Calculation (min) 46 min    Activity Tolerance Patient tolerated treatment well    Behavior During Therapy [facility] for tasks assessed/performed           Past Medical History:  Diagnosis Date  . Acquired hypothyroidism 12/22/2015  . Back pain 04/05/2019  . Cerebral palsy   . Female stress incontinence   . Generalized anxiety disorder   . Hydrocephalus    Congenital  . Insomnia   . Laryngopharyngeal reflux (LPR) 03/08/2016  . Major depressive disorder   . 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 Neck pain 04/05/2019  . 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 Symptomatic mammary hypertrophy 04/05/2019  . VP (ventriculoperitoneal) shunt status     Past Surgical History:  Procedure Laterality Date  . heal surgery    . VENTRICULOPERITONEAL SHUNT  11/16/2011   Procedure: SHUNT INSERTION VENTRICULAR-PERITONEAL;  Surgeon: Maeola Harman, MD;  Location: [facility];  Service: Neurosurgery;  Laterality: Right;  Ventricular Peritoneal Shunt Insertion/Revision with Laparoscopic Abdominal Approach    There were no vitals filed for this visit.    Subjective Assessment - 08/02/19 0752    Subjective Patient presents with chronic mid and low back pain.   She also feels "top heavy" and chest adds to the balance challenge of walking as well as back pain.  Pain does not radiate to legs, arms. L LE chronically  weak.    Pertinent History Cerebral palsy, VP shunt    Limitations Lifting;Walking;House hold activities    How long can you sit comfortably? 30-1 hr    How long can you stand comfortably? 10 min    How long can you walk comfortably? walks with mom and has to stop to rest, 30 min pain can be 5/10    Diagnostic tests none    Patient Stated Goals Patient would like to less pain and feel better.    Currently in Pain? Yes    Pain Score 2     Pain Location Back    Pain Orientation Mid;Lower    Pain Descriptors / Indicators Aching;Tightness;Other (Comment)   pulling   Pain Type Chronic pain    Pain Onset More than a month ago    Pain Frequency Intermittent    Aggravating Factors  standing and walking, sitting increases pain as well, >30    Pain Relieving Factors sitting, rest, can take OTC meds    Effect of Pain on Daily Activities limits mobility, hard to work at home    Multiple Pain Sites No              [facility] PT Assessment - 08/02/19 0001      Assessment   Medical Diagnosis chronic pain, thoracic pain     Referring Provider (PT) Dr. Foster Simpson     Prior Therapy Yes  Precautions   Precautions None    Precaution Comments L AFO       Restrictions   Weight Bearing Restrictions No      Balance Screen   Has the patient fallen in the past 6 months Yes    How many times? 1   foot drop, missed a step    Has the patient had a decrease in activity level because of a fear of falling?  Yes    Is the patient reluctant to leave their home because of a fear of falling?  No      Home Environment   Living Environment Private residence    Living Arrangements Parent    Type of Home House    Home Access Stairs to enter    Entrance Stairs-Number of Steps 4    Entrance Stairs-Rails Right;Left      Prior Function   Level of Independence Independent    Vocation Full time employment    Leisure walks with mom , likes live music       Cognition   Overall Cognitive Status  Within Functional Limits for tasks assessed      Observation/Other Assessments   Focus on Therapeutic Outcomes (FOTO)  NT due to diagnosis       Sensation   Light Touch Appears Intact      Posture/Postural Control   Posture Comments pelvic rotation, R  leg longer than L       AROM   Lumbar Flexion WFL    Lumbar Extension WFL min pain Rt side     Lumbar - Right Rotation 25% stiff    Lumbar - Left Rotation 25% stiff       PROM   Overall PROM Comments L ankle lacks > 20 deg DF , 0/5 strength in L ankle       Strength   Right Hip Flexion 5/5    Left Hip Flexion 4-/5    Right Knee Flexion 5/5    Right Knee Extension 5/5    Left Knee Flexion 4-/5    Left Knee Extension 4/5      Palpation   Palpation comment min discomfort in mid and low back centrally and along paraspinals       Transfers   Transfers Sit to Stand    Sit to Stand 6: Modified independent (Device/Increase time)    Five time sit to stand comments  NT on eval       Ambulation/Gait   Ambulation Distance (Feet) 150 Feet    Assistive device None    Gait Pattern Step-to pattern;Decreased hip/knee flexion - left;Decreased dorsiflexion - left;Left circumduction;Left hip hike    Ambulation Surface Level;Indoor             Objective measurements completed on examination: See above findings.       PT Education - 08/02/19 0900    Education Details PT/POC, HEP, stretching    Person(s) Educated Patient    Methods Explanation;Handout    Comprehension Verbalized understanding;Returned demonstration            PT Short Term Goals - 08/02/19 0902      PT SHORT TERM GOAL #1   Title Pt will be I with initial HEP for LE, back flexibility and posture    Baseline unknown    Time 4    Period Weeks    Status New    Target Date 08/30/19      PT SHORT TERM GOAL #2  Title Pt will be able to report less pain with sitting up to an hour at work    Baseline pain can be moderate, 5/10 much of the day, does not take  standing breaks as often as she should    Time 4    Period Weeks    Status New    Target Date 08/30/19      PT SHORT TERM GOAL #3   Title Pt will be able to walk with mom 20 min with pain < 3/10.    Time 4    Period Weeks    Status New    Target Date 08/30/19             PT Long Term Goals - 08/02/19 0906      PT LONG TERM GOAL #1   Title Pt will be I with HEP for long term low back care    Time 8    Period Weeks    Status New    Target Date 09/27/19      PT LONG TERM GOAL #2   Title Pt will be able to walk/stand up to 30 min </= 3/10 back pain    Baseline 5/10 or more    Time 8    Period Weeks    Status New    Target Date 09/27/19      PT LONG TERM GOAL #3   Title Pt will be able to lift light sized items (waist hgt to floor) with good body mechanics to preserve low back    Time 8    Period Weeks    Status New    Target Date 09/27/19      PT LONG TERM GOAL #4   Title Pt will be able to show corrected posture for sitting, workstation to reduce stain on neck and upper back.    Time 8    Period Weeks    Status New    Target Date 09/27/19                  Plan - 08/02/19 0914    Clinical Impression Statement Patient presents for low complexity eval of back pain for > 3 mos focused mainly in thoracic spine, occasionally in lower back. She presents with limitations related to cerebral palsy affected L UE and LE.  She has balance and gait abnormalities, and with pain she is limited further.  She has L foot drop, does not wear her AFO a she knows she should. Her L ankle lacks 20 deg or more DF .  She does not currently exercise other than walking.  She should expect some improvement in back pain with corrective exercises.  Recommend she do at least 4-6 visits for full benefit of PT.    Personal Factors and Comorbidities Comorbidity 1;Time since onset of injury/illness/exacerbation    Comorbidities Cerebral palsy    Examination-Activity Limitations  Squat;Locomotion Level;Reach Overhead;Stand;Carry;Bend;Lift;Bed Mobility;Transfers;Sit    Examination-Participation Restrictions Interpersonal Relationship;Community Activity    Stability/Clinical Decision Making Stable/Uncomplicated    Clinical Decision Making Low    Rehab Potential Excellent    PT Frequency 1x / week    PT Duration 8 weeks    PT Treatment/Interventions ADLs/Self Care Home Management;Moist Heat;Functional mobility training;Therapeutic activities;Therapeutic exercise;Patient/family education;Taping;Passive range of motion;Cryotherapy;Manual techniques;Spinal Manipulations    PT Next Visit Plan check HEP, thoracic mobs , stretch ankle and hamstring    PT Home Exercise Plan knee to chest, LTR, upper trunk rotation , corner stretch  Consulted and Agree with Plan of Care Patient           Patient will benefit from skilled therapeutic intervention in order to improve the following deficits and impairments:  Abnormal gait, Decreased balance, Decreased mobility, Difficulty walking, Hypomobility, Improper body mechanics, Decreased range of motion, Pain, Postural dysfunction, Impaired UE functional use, Impaired flexibility, Decreased strength, Increased fascial restricitons  Visit Diagnosis: Pain in thoracic spine  Other abnormalities of gait and mobility     Problem List Patient Active Problem List   Diagnosis Date Noted  . 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 Major depressive disorder   . Generalized anxiety disorder   . Female stress incontinence   . Insomnia   . Menopausal symptom   . Back pain 04/05/2019  . Neck pain 04/05/2019  . Symptomatic mammary hypertrophy 04/05/2019  . Laryngopharyngeal reflux (LPR) 03/08/2016  . Acquired hypothyroidism 12/22/2015  . 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 Cerebral palsy 05/02/2014  . Shunt malfunction 11/14/2011  . Hydrocephalus (HCC) 11/14/2011     PAA,JENNIFER 08/02/2019, 6:28 PM  [facility] [address] [place], [place], [address] Phone: [phone]   Fax:  [phone]  Name: Sophia Marks MRN: [id number] Date of Birth: [birth year]   Karie Mainland, PT 08/02/19 6:28 PM Phone: [phone] Fax: [phone]

## 2019-08-06 ENCOUNTER — Ambulatory Visit: Payer: Medicaid Other | Admitting: Physical Therapy

## 2019-08-06 ENCOUNTER — Other Ambulatory Visit: Payer: Self-pay

## 2019-08-06 ENCOUNTER — Encounter: Payer: Self-pay | Admitting: Physical Therapy

## 2019-08-06 DIAGNOSIS — M546 Pain in thoracic spine: Secondary | ICD-10-CM

## 2019-08-06 DIAGNOSIS — R2689 Other abnormalities of gait and mobility: Secondary | ICD-10-CM

## 2019-08-06 NOTE — Patient Instructions (Signed)
Access Code: [email or web address]: [email or web address]: 07/06/2021Prepared by: Victorino Dike PaaExercises  Supine Lower Trunk Rotation - 2 x daily - 7 x weekly - 2 sets - 10 reps - 10 hold  Supine Single Knee to Chest Stretch - 2 x daily - 7 x weekly - 1 sets - 3 reps - 30 hold  Sidelying Thoracic Rotation with Open Book - 2 x daily - 7 x weekly - 2 sets - 10 reps - 10 hold  Corner Pec Major Stretch - 2 x daily - 7 x weekly - 1 sets - 5 reps - 30 hold  Thomas Stretch on Table - 2 x daily - 7 x weekly - 1 sets - 3 reps - 30-60 sec hold  Sidelying Quadratus Lumborum Stretch on Table - 2 x daily - 7 x weekly - 1 sets - 3 reps - 30-60 hold

## 2019-08-06 NOTE — Therapy (Signed)
[facility] [address] [place], [place], [address] Phone: [phone]   Fax:  [phone]  Physical Therapy Treatment  Patient Details  Name: Sophia Marks MRN: [id number] Date of Birth: Jun 06, 1980 Referring Provider (PT): Dr. Foster Simpson    Encounter Date: 08/06/2019   PT End of Session - 08/06/19 1457    Visit Number 2    Number of Visits 8    Date for PT Re-Evaluation 09/27/19    Authorization Type U[employer] Medicaid    PT Start Time 1450    PT Stop Time 1539    PT Time Calculation (min) 49 min    Activity Tolerance Patient tolerated treatment well    Behavior During Therapy [facility] for tasks assessed/performed           Past Medical History:  Diagnosis Date  . Acquired hypothyroidism 12/22/2015  . Back pain 04/05/2019  . Cerebral palsy   . Female stress incontinence   . Generalized anxiety disorder   . Hydrocephalus    Congenital  . Insomnia   . Laryngopharyngeal reflux (LPR) 03/08/2016  . Major depressive disorder   . 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 Neck pain 04/05/2019  . 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 Symptomatic mammary hypertrophy 04/05/2019  . VP (ventriculoperitoneal) shunt status     Past Surgical History:  Procedure Laterality Date  . heal surgery    . VENTRICULOPERITONEAL SHUNT  11/16/2011   Procedure: SHUNT INSERTION VENTRICULAR-PERITONEAL;  Surgeon: Maeola Harman, MD;  Location: [facility];  Service: Neurosurgery;  Laterality: Right;  Ventricular Peritoneal Shunt Insertion/Revision with Laparoscopic Abdominal Approach    There were no vitals filed for this visit.   Subjective Assessment - 08/06/19 1451    Subjective Back is 2/10. Has been doing her exercises .    Currently in Pain? Yes    Pain Score 2     Pain Location Back    Pain Orientation Lower    Pain Descriptors / Indicators Aching    Pain Type Chronic pain     Pain Onset More than a month ago    Pain Frequency Intermittent               [facility] Adult PT Treatment/Exercise - 08/06/19 0001      Lumbar Exercises: Stretches   Active Hamstring Stretch 3 reps;30 seconds    Active Hamstring Stretch Limitations straps     Single Knee to Chest Stretch 2 reps;30 seconds    Lower Trunk Rotation 10 seconds    Lower Trunk Rotation Limitations x 10     Hip Flexor Stretch Left;5 reps    Hip Flexor Stretch Limitations done in sidelying and in supine Thomas test     Other Lumbar Stretch Exercise adductor stretch with strap x 2 , 30 sec     Other Lumbar Stretch Exercise Quadratus lumborum off edge of table with manual       Lumbar Exercises: Aerobic   Nustep L5 UE and LE , 5 min       Manual Therapy   Manual Therapy Joint mobilization;Soft tissue mobilization;Passive ROM    Manual therapy comments manual caudal pressure to L iliac crest to elongate L trunk     Joint Mobilization P/A mobs along thoracic and lumbar spine     Soft tissue mobilization paraspinals thoracic and lumbar     Passive ROM hamstring LLE and L hip flexor  PT Education - 08/06/19 1541    Education Details gait pattern and tightness, wear AFO, leg length    Person(s) Educated Patient    Methods Explanation;Demonstration;Handout;Verbal cues            PT Short Term Goals - 08/02/19 0902      PT SHORT TERM GOAL #1   Title Pt will be I with initial HEP for LE, back flexibility and posture    Baseline unknown    Time 4    Period Weeks    Status New    Target Date 08/30/19      PT SHORT TERM GOAL #2   Title Pt will be able to report less pain with sitting up to an hour at work    Baseline pain can be moderate, 5/10 much of the day, does not take standing breaks as often as she should    Time 4    Period Weeks    Status New    Target Date 08/30/19      PT SHORT TERM GOAL #3   Title Pt will be able to walk with mom 20 min with pain < 3/10.     Time 4    Period Weeks    Status New    Target Date 08/30/19             PT Long Term Goals - 08/02/19 0906      PT LONG TERM GOAL #1   Title Pt will be I with HEP for long term low back care    Time 8    Period Weeks    Status New    Target Date 09/27/19      PT LONG TERM GOAL #2   Title Pt will be able to walk/stand up to 30 min </= 3/10 back pain    Baseline 5/10 or more    Time 8    Period Weeks    Status New    Target Date 09/27/19      PT LONG TERM GOAL #3   Title Pt will be able to lift light sized items (waist hgt to floor) with good body mechanics to preserve low back    Time 8    Period Weeks    Status New    Target Date 09/27/19      PT LONG TERM GOAL #4   Title Pt will be able to show corrected posture for sitting, workstation to reduce stain on neck and upper back.    Time 8    Period Weeks    Status New    Target Date 09/27/19                 Plan - 08/06/19 1458    Clinical Impression Statement Patient did well today,able to stretch and report less pain with bed mobility, transfers on and off the mat.  She presents with L anterior hip, quad tightness in addition to L ankle contracture into plantarflexion.  Explained the effect of her gait on these muscle attachments.    Personal Factors and Comorbidities Comorbidity 1;Time since onset of injury/illness/exacerbation    Comorbidities Cerebral palsy    Examination-Activity Limitations Squat;Locomotion Level;Reach Overhead;Stand;Carry;Bend;Lift;Bed Mobility;Transfers;Sit    Examination-Participation Restrictions Interpersonal Relationship;Community Activity    PT Frequency 1x / week    PT Duration 8 weeks    PT Treatment/Interventions ADLs/Self Care Home Management;Moist Heat;Functional mobility training;Therapeutic activities;Therapeutic exercise;Patient/family education;Taping;Passive range of motion;Cryotherapy;Manual techniques;Spinal Manipulations    PT Next Visit  Plan check HEP, thoracic  mobs , stretch ankle and hamstring    PT Home Exercise Plan knee to chest, LTR, upper trunk rotation , corner stretch    Consulted and Agree with Plan of Care Patient           Patient will benefit from skilled therapeutic intervention in order to improve the following deficits and impairments:  Abnormal gait, Decreased balance, Decreased mobility, Difficulty walking, Hypomobility, Improper body mechanics, Decreased range of motion, Pain, Postural dysfunction, Impaired UE functional use, Impaired flexibility, Decreased strength, Increased fascial restricitons  Visit Diagnosis: Pain in thoracic spine  Other abnormalities of gait and mobility     Problem List Patient Active Problem List   Diagnosis Date Noted  . 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 Major depressive disorder   . Generalized anxiety disorder   . Female stress incontinence   . Insomnia   . Menopausal symptom   . Back pain 04/05/2019  . Neck pain 04/05/2019  . Symptomatic mammary hypertrophy 04/05/2019  . Laryngopharyngeal reflux (LPR) 03/08/2016  . Acquired hypothyroidism 12/22/2015  . 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 Cerebral palsy 05/02/2014  . Shunt malfunction 11/14/2011  . Hydrocephalus (HCC) 11/14/2011    Marlys Stegmaier 08/06/2019, 3:51 PM  [facility] [address] [place], [place], [address] Phone: [phone]   Fax:  [phone]  Name: Sophia Marks MRN: [id number] Date of Birth: [birth year]  Karie Mainland, PT 08/06/19 3:51 PM Phone: [phone] Fax: [phone]

## 2019-08-16 ENCOUNTER — Encounter: Payer: Self-pay | Admitting: Physical Therapy

## 2019-08-16 ENCOUNTER — Other Ambulatory Visit: Payer: Self-pay

## 2019-08-16 ENCOUNTER — Ambulatory Visit: Payer: Medicaid Other | Admitting: Physical Therapy

## 2019-08-16 DIAGNOSIS — M546 Pain in thoracic spine: Secondary | ICD-10-CM | POA: Diagnosis not present

## 2019-08-16 DIAGNOSIS — R2689 Other abnormalities of gait and mobility: Secondary | ICD-10-CM

## 2019-08-16 NOTE — Therapy (Signed)
[facility] [address] [place], [place], [address] Phone: [phone]   Fax:  [phone]  Physical Therapy Treatment  Patient Details  Name: Sophia Marks MRN: [id number] Date of Birth: 11-29-1980 Referring Provider (PT): Dr. Foster Simpson    Encounter Date: 08/16/2019   PT End of Session - 08/16/19 1044    Visit Number 3    Number of Visits 8    Date for PT Re-Evaluation 09/27/19    Authorization Type UHC Medicaid    PT Start Time 1008    PT Stop Time 1054    PT Time Calculation (min) 46 min           Past Medical History:  Diagnosis Date  . Acquired hypothyroidism 12/22/2015  . Back pain 04/05/2019  . Cerebral palsy   . Female stress incontinence   . Generalized anxiety disorder   . Hydrocephalus    Congenital  . Insomnia   . Laryngopharyngeal reflux (LPR) 03/08/2016  . Major depressive disorder   . 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 Neck pain 04/05/2019  . 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 Symptomatic mammary hypertrophy 04/05/2019  . VP (ventriculoperitoneal) shunt status     Past Surgical History:  Procedure Laterality Date  . heal surgery    . VENTRICULOPERITONEAL SHUNT  11/16/2011   Procedure: SHUNT INSERTION VENTRICULAR-PERITONEAL;  Surgeon: Maeola Harman, MD;  Location: [facility];  Service: Neurosurgery;  Laterality: Right;  Ventricular Peritoneal Shunt Insertion/Revision with Laparoscopic Abdominal Approach    There were no vitals filed for this visit.   Subjective Assessment - 08/16/19 1010    Subjective I was able to take a bath yesterday, banged my leg on the tub.  Back hurts a little.    Currently in Pain? Yes    Pain Score 2     Pain Location Back    Pain Orientation Right;Lower    Pain Descriptors / Indicators Aching    Pain Type Chronic pain    Pain Onset More than a month ago    Pain Frequency Intermittent              [facility] Adult PT Treatment/Exercise - 08/16/19 0001      Lumbar Exercises: Stretches   Active Hamstring Stretch 3 reps;30 seconds    Lower Trunk Rotation 10 seconds    Lower Trunk Rotation Limitations x 10     Hip Flexor Stretch Right;Other (comment)    Hip Flexor Stretch Limitations 3 min     Other Lumbar Stretch Exercise POE x 5 for T-ext     Other Lumbar Stretch Exercise adductors with black band      Lumbar Exercises: Supine   Bridge 10 reps    Bridge Limitations PT assist for L foot       Lumbar Exercises: Quadruped   Other Quadruped Lumbar Exercises adductor stretch x 3 each side wide knees       Manual Therapy   Manual therapy comments LAD Rt LE x 5     Joint Mobilization P/A mobs along thoracic and lumbar spine     Passive ROM Rt hip PROM all planes           IASTM Rt thigh           PT Short Term Goals - 08/02/19 0902      PT SHORT TERM GOAL #1   Title Pt will be I with initial HEP  for LE, back flexibility and posture    Baseline unknown    Time 4    Period Weeks    Status New    Target Date 08/30/19      PT SHORT TERM GOAL #2   Title Pt will be able to report less pain with sitting up to an hour at work    Baseline pain can be moderate, 5/10 much of the day, does not take standing breaks as often as she should    Time 4    Period Weeks    Status New    Target Date 08/30/19      PT SHORT TERM GOAL #3   Title Pt will be able to walk with mom 20 min with pain < 3/10.    Time 4    Period Weeks    Status New    Target Date 08/30/19             PT Long Term Goals - 08/02/19 0906      PT LONG TERM GOAL #1   Title Pt will be I with HEP for long term low back care    Time 8    Period Weeks    Status New    Target Date 09/27/19      PT LONG TERM GOAL #2   Title Pt will be able to walk/stand up to 30 min </= 3/10 back pain    Baseline 5/10 or more    Time 8    Period Weeks    Status New    Target Date 09/27/19      PT LONG TERM  GOAL #3   Title Pt will be able to lift light sized items (waist hgt to floor) with good body mechanics to preserve low back    Time 8    Period Weeks    Status New    Target Date 09/27/19      PT LONG TERM GOAL #4   Title Pt will be able to show corrected posture for sitting, workstation to reduce stain on neck and upper back.    Time 8    Period Weeks    Status New    Target Date 09/27/19                 Plan - 08/16/19 1053    Clinical Impression Statement Focus on stretches today.  Pain with prone knee flexion, hip flexors, quads, adductors tight.  She has been consistent with HEP. She continues to walk with her mom but pain is staying about the same in low back.    PT Treatment/Interventions ADLs/Self Care Home Management;Moist Heat;Functional mobility training;Therapeutic activities;Therapeutic exercise;Patient/family education;Taping;Passive range of motion;Cryotherapy;Manual techniques;Spinal Manipulations    PT Next Visit Plan check HEP, thoracic mobs , stretch ankle and hamstring. include upper back    PT Home Exercise Plan knee to chest, LTR, upper trunk rotation , corner stretch, hip flexor    Consulted and Agree with Plan of Care Patient           Patient will benefit from skilled therapeutic intervention in order to improve the following deficits and impairments:  Abnormal gait, Decreased balance, Decreased mobility, Difficulty walking, Hypomobility, Improper body mechanics, Decreased range of motion, Pain, Postural dysfunction, Impaired UE functional use, Impaired flexibility, Decreased strength, Increased fascial restricitons  Visit Diagnosis: Pain in thoracic spine  Other abnormalities of gait and mobility     Problem List Patient Active Problem List   Diagnosis  Date Noted  . 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 Major depressive disorder   . Generalized anxiety disorder   . Female stress incontinence   . Insomnia    . Menopausal symptom   . Back pain 04/05/2019  . Neck pain 04/05/2019  . Symptomatic mammary hypertrophy 04/05/2019  . Laryngopharyngeal reflux (LPR) 03/08/2016  . Acquired hypothyroidism 12/22/2015  . 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 Cerebral palsy 05/02/2014  . Shunt malfunction 11/14/2011  . Hydrocephalus (HCC) 11/14/2011    Sophia Marks 08/16/2019, 11:03 AM  [facility] [address] [place], [place], [address] Phone: [phone]   Fax:  [phone]  Name: Sophia Marks MRN: [id number] Date of Birth: 1980-08-17  Karie Mainland, PT 08/16/19 11:03 AM Phone: [phone] Fax: [phone]

## 2019-08-21 ENCOUNTER — Encounter: Payer: Self-pay | Admitting: Physical Therapy

## 2019-08-21 ENCOUNTER — Ambulatory Visit: Payer: Medicaid Other | Admitting: Physical Therapy

## 2019-08-21 ENCOUNTER — Other Ambulatory Visit: Payer: Self-pay

## 2019-08-21 DIAGNOSIS — M546 Pain in thoracic spine: Secondary | ICD-10-CM

## 2019-08-21 DIAGNOSIS — R2689 Other abnormalities of gait and mobility: Secondary | ICD-10-CM

## 2019-08-21 NOTE — Therapy (Signed)
[facility] [address] [place], [place], [address] Phone: [phone]   Fax:  [phone]  Physical Therapy Treatment  Patient Details  Name: Sophia Marks MRN: [id number] Date of Birth: 1980-05-21 Referring Provider (PT): Dr. Foster Simpson    Encounter Date: 08/21/2019   PT End of Session - 08/21/19 1103    Visit Number 4    Number of Visits 8    Date for PT Re-Evaluation 09/27/19    Authorization Type UHC Medicaid    PT Start Time 0745    PT Stop Time 0830    PT Time Calculation (min) 45 min    Activity Tolerance Patient tolerated treatment well    Behavior During Therapy [facility] for tasks assessed/performed           Past Medical History:  Diagnosis Date  . Acquired hypothyroidism 12/22/2015  . Back pain 04/05/2019  . Cerebral palsy   . Female stress incontinence   . Generalized anxiety disorder   . Hydrocephalus    Congenital  . Insomnia   . Laryngopharyngeal reflux (LPR) 03/08/2016  . Major depressive disorder   . 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 Neck pain 04/05/2019  . 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 Symptomatic mammary hypertrophy 04/05/2019  . VP (ventriculoperitoneal) shunt status     Past Surgical History:  Procedure Laterality Date  . heal surgery    . VENTRICULOPERITONEAL SHUNT  11/16/2011   Procedure: SHUNT INSERTION VENTRICULAR-PERITONEAL;  Surgeon: Maeola Harman, MD;  Location: [facility];  Service: Neurosurgery;  Laterality: Right;  Ventricular Peritoneal Shunt Insertion/Revision with Laparoscopic Abdominal Approach    There were no vitals filed for this visit.   Subjective Assessment - 08/21/19 0748    Subjective Back is feeling stiff (mid). Got an injection in my wrist.    Currently in Pain? Yes    Pain Score --   not rated   Pain Location Back    Pain Orientation Mid;Upper    Pain Descriptors / Indicators Tightness     Pain Type Chronic pain    Pain Onset More than a month ago    Pain Frequency Intermittent    Aggravating Factors  standing, walking    Pain Relieving Factors sitting, rest    Multiple Pain Sites No              [facility] Adult PT Treatment/Exercise - 08/21/19 0001      Lumbar Exercises: Stretches   Other Lumbar Stretch Exercise sidelying upper trunk rotation x 5       Lumbar Exercises: Aerobic   UBE (Upper Arm Bike) 5 min L1 and  changed directions at 2 min       Lumbar Exercises: Standing   Scapular Retraction Limitations isometric x 10 into wall     Row Strengthening    Theraband Level (Row) Level 3 (Green)    Shoulder Extension Strengthening    Theraband Level (Shoulder Extension) Level 2 (Red)    Shoulder Extension Limitations unable without crepitus       Shoulder Exercises: Standing   Horizontal ABduction Strengthening;Both;20 reps    Theraband Level (Shoulder Horizontal ABduction) Level 2 (Red)    Horizontal ABduction Weight (lbs) at wall       Manual Therapy   Joint Mobilization P/A mobs along thoracic and lumbar spine     Soft tissue mobilization bilat. lumbar paraspinals  PT Education - 08/21/19 1103    Education Details posture, alignment in neck and shoulders    Person(s) Educated Patient    Methods Explanation;Tactile cues;Verbal cues    Comprehension Verbalized understanding;Need further instruction            PT Short Term Goals - 08/02/19 0902      PT SHORT TERM GOAL #1   Title Pt will be I with initial HEP for LE, back flexibility and posture    Baseline unknown    Time 4    Period Weeks    Status New    Target Date 08/30/19      PT SHORT TERM GOAL #2   Title Pt will be able to report less pain with sitting up to an hour at work    Baseline pain can be moderate, 5/10 much of the day, does not take standing breaks as often as she should    Time 4    Period Weeks    Status New    Target Date 08/30/19      PT  SHORT TERM GOAL #3   Title Pt will be able to walk with mom 20 min with pain < 3/10.    Time 4    Period Weeks    Status New    Target Date 08/30/19             PT Long Term Goals - 08/02/19 0906      PT LONG TERM GOAL #1   Title Pt will be I with HEP for long term low back care    Time 8    Period Weeks    Status New    Target Date 09/27/19      PT LONG TERM GOAL #2   Title Pt will be able to walk/stand up to 30 min </= 3/10 back pain    Baseline 5/10 or more    Time 8    Period Weeks    Status New    Target Date 09/27/19      PT LONG TERM GOAL #3   Title Pt will be able to lift light sized items (waist hgt to floor) with good body mechanics to preserve low back    Time 8    Period Weeks    Status New    Target Date 09/27/19      PT LONG TERM GOAL #4   Title Pt will be able to show corrected posture for sitting, workstation to reduce stain on neck and upper back.    Time 8    Period Weeks    Status New    Target Date 09/27/19                 Plan - 08/21/19 1105    Clinical Impression Statement Worked on thoracic mobility today, improving upper back and scapular strength.  She tends to elevate shoulders with extension and rows, the wall was helpful for this issue.    PT Treatment/Interventions ADLs/Self Care Home Management;Moist Heat;Functional mobility training;Therapeutic activities;Therapeutic exercise;Patient/family education;Taping;Passive range of motion;Cryotherapy;Manual techniques;Spinal Manipulations    PT Next Visit Plan check HEP, thoracic mobs , stretch ankle and hamstring. include upper back    PT Home Exercise Plan knee to chest, LTR, upper trunk rotation , corner stretch, hip flexor, row, horiz pull    Consulted and Agree with Plan of Care Patient           Patient will benefit from skilled therapeutic intervention  in order to improve the following deficits and impairments:  Abnormal gait, Decreased balance, Decreased mobility,  Difficulty walking, Hypomobility, Improper body mechanics, Decreased range of motion, Pain, Postural dysfunction, Impaired UE functional use, Impaired flexibility, Decreased strength, Increased fascial restricitons  Visit Diagnosis: No diagnosis found.     Problem List Patient Active Problem List   Diagnosis Date Noted  . 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 Major depressive disorder   . Generalized anxiety disorder   . Female stress incontinence   . Insomnia   . Menopausal symptom   . Back pain 04/05/2019  . Neck pain 04/05/2019  . Symptomatic mammary hypertrophy 04/05/2019  . Laryngopharyngeal reflux (LPR) 03/08/2016  . Acquired hypothyroidism 12/22/2015  . 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 Cerebral palsy 05/02/2014  . Shunt malfunction 11/14/2011  . Hydrocephalus (HCC) 11/14/2011    Dwayne Bulkley 08/21/2019, 1:46 PM  [facility] [address] [place], [place], [address] Phone: [phone]   Fax:  [phone]  Name: Sophia Marks MRN: [id number] Date of Birth: 01-17-1981  Karie Mainland, PT 08/21/19 1:46 PM Phone: [phone] Fax: [phone]

## 2019-08-21 NOTE — Patient Instructions (Signed)
Resisted Horizontal Abduction: Bilateral    Sit or stand, tubing in both hands, arms out in front. Keeping arms straight, pinch shoulder blades together and stretch arms out. Repeat ___10_ times per set. Do ___2_ sets per session. Do ___1_ sessions per day. Do this against the wall.   http://orth.exer.us/969   Copyright  VHI. All rights reserved.  Low Row: Thumbs Up    Face anchor, medium to wide stance. Thumbs up, pull arms back, squeezing shoulder blades together.  Repeat _10_ times per set. Do _2-3_ sets per session. Do __1 sessions per day. RELAX NECK AND SHOULDERS.  Anchor Height: Waist  http://tub.exer.us/68   Copyright  VHI. All rights reserved.

## 2019-08-30 ENCOUNTER — Encounter: Payer: Self-pay | Admitting: Physical Therapy

## 2019-08-30 ENCOUNTER — Other Ambulatory Visit: Payer: Self-pay

## 2019-08-30 ENCOUNTER — Ambulatory Visit: Payer: Medicaid Other | Admitting: Physical Therapy

## 2019-08-30 DIAGNOSIS — M546 Pain in thoracic spine: Secondary | ICD-10-CM

## 2019-08-30 DIAGNOSIS — R2689 Other abnormalities of gait and mobility: Secondary | ICD-10-CM

## 2019-08-30 NOTE — Therapy (Signed)
[facility] [address] [place], [place], [address] Phone: [phone]   Fax:  [phone]  Physical Therapy Treatment  Patient Details  Name: Sophia Marks MRN: [id number] Date of Birth: 03-13-1980 Referring Provider (PT): Dr. Foster Simpson    Encounter Date: 08/30/2019   PT End of Session - 08/30/19 0751    Visit Number 5    Number of Visits 8    Date for PT Re-Evaluation 09/27/19    Authorization Type UHC Medicaid    PT Start Time 0749    PT Stop Time 0843    PT Time Calculation (min) 54 min    Activity Tolerance Patient tolerated treatment well    Behavior During Therapy [facility] for tasks assessed/performed           Past Medical History:  Diagnosis Date  . Acquired hypothyroidism 12/22/2015  . Back pain 04/05/2019  . Cerebral palsy   . Female stress incontinence   . Generalized anxiety disorder   . Hydrocephalus    Congenital  . Insomnia   . Laryngopharyngeal reflux (LPR) 03/08/2016  . Major depressive disorder   . 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 Neck pain 04/05/2019  . 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 Symptomatic mammary hypertrophy 04/05/2019  . VP (ventriculoperitoneal) shunt status     Past Surgical History:  Procedure Laterality Date  . heal surgery    . VENTRICULOPERITONEAL SHUNT  11/16/2011   Procedure: SHUNT INSERTION VENTRICULAR-PERITONEAL;  Surgeon: Maeola Harman, MD;  Location: [facility];  Service: Neurosurgery;  Laterality: Right;  Ventricular Peritoneal Shunt Insertion/Revision with Laparoscopic Abdominal Approach    There were no vitals filed for this visit.   Subjective Assessment - 08/30/19 0752    Subjective Back is hurting all over.  About 5/10, been doing my stretches. Does not use heat.    Currently in Pain? Yes    Pain Score 5     Pain Location Back    Pain Orientation Right;Left;Upper;Mid;Lower    Pain  Descriptors / Indicators Tightness    Pain Type Chronic pain    Pain Onset More than a month ago    Pain Frequency Intermittent    Aggravating Factors  standing, walking    Pain Relieving Factors rest, heat?               [facility] Adult PT Treatment/Exercise - 08/30/19 0001      Lumbar Exercises: Supine   Bridge Limitations with ball x 10 x 2 sets     Other Supine Lumbar Exercises LTR with trunk rotation x 10 each side     Other Supine Lumbar Exercises hamstring curls x 10       Lumbar Exercises: Quadruped   Madcat/Old Horse Limitations modified on elbows with max cues, spine stiffness       Shoulder Exercises: Supine   Horizontal ABduction Strengthening;Both;15 reps    Theraband Level (Shoulder Horizontal ABduction) Level 2 (Red)    External Rotation Strengthening;Both;15 reps;Theraband    Theraband Level (Shoulder External Rotation) Level 2 (Red)    Shoulder Flexion Weight (lbs) supine scapular stability x 10 overhead       Modalities   Modalities Moist Heat      Moist Heat Therapy   Number Minutes Moist Heat 10 Minutes    Moist Heat Location Lumbar Spine   thoracic as well     Manual Therapy   Joint Mobilization P/A mobs along  thoracic and lumbar spine     Soft tissue mobilization compression to thoracic and lumbar , brief                  PT Short Term Goals - 08/30/19 0754      PT SHORT TERM GOAL #1   Title Pt will be I with initial HEP for LE, back flexibility and posture    Status Achieved      PT SHORT TERM GOAL #2   Title Pt will be able to report less pain with sitting up to an hour at work    Status On-going      PT SHORT TERM GOAL #3   Title Pt will be able to walk with mom 20 min with pain < 3/10.    Baseline 5/10    Status On-going             PT Long Term Goals - 08/02/19 0906      PT LONG TERM GOAL #1   Title Pt will be I with HEP for long term low back care    Time 8    Period Weeks    Status New    Target Date 09/27/19       PT LONG TERM GOAL #2   Title Pt will be able to walk/stand up to 30 min </= 3/10 back pain    Baseline 5/10 or more    Time 8    Period Weeks    Status New    Target Date 09/27/19      PT LONG TERM GOAL #3   Title Pt will be able to lift light sized items (waist hgt to floor) with good body mechanics to preserve low back    Time 8    Period Weeks    Status New    Target Date 09/27/19      PT LONG TERM GOAL #4   Title Pt will be able to show corrected posture for sitting, workstation to reduce stain on neck and upper back.    Time 8    Period Weeks    Status New    Target Date 09/27/19                 Plan - 08/30/19 0950    Clinical Impression Statement Patient with increased pain today but able to tolerate mat level exercises well.  Continues to need cues for reducing neck tension and spine mobility wih exercises.    PT Treatment/Interventions ADLs/Self Care Home Management;Moist Heat;Functional mobility training;Therapeutic activities;Therapeutic exercise;Patient/family education;Taping;Passive range of motion;Cryotherapy;Manual techniques;Spinal Manipulations    PT Next Visit Plan thoracic mobs, spine mobilty, try modified cat/camel to alleviate arm strain    PT Home Exercise Plan knee to chest, LTR, upper trunk rotation , corner stretch, hip flexor, row, horiz pull    Consulted and Agree with Plan of Care Patient           Patient will benefit from skilled therapeutic intervention in order to improve the following deficits and impairments:  Abnormal gait, Decreased balance, Decreased mobility, Difficulty walking, Hypomobility, Improper body mechanics, Decreased range of motion, Pain, Postural dysfunction, Impaired UE functional use, Impaired flexibility, Decreased strength, Increased fascial restricitons  Visit Diagnosis: Pain in thoracic spine  Other abnormalities of gait and mobility     Problem List Patient Active Problem List   Diagnosis Date Noted  .  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 Major depressive disorder   .  Generalized anxiety disorder   . Female stress incontinence   . Insomnia   . Menopausal symptom   . Back pain 04/05/2019  . Neck pain 04/05/2019  . Symptomatic mammary hypertrophy 04/05/2019  . Laryngopharyngeal reflux (LPR) 03/08/2016  . Acquired hypothyroidism 12/22/2015  . 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 Cerebral palsy 05/02/2014  . Shunt malfunction 11/14/2011  . Hydrocephalus (HCC) 11/14/2011    Nakaila Freeze 08/30/2019, 10:20 AM  [facility] [address] [place], [place], [address] Phone: [phone]   Fax:  [phone]  Name: Sophia Marks MRN: [id number] Date of Birth: 08-Nov-1980  Karie Mainland, PT 08/30/19 10:20 AM Phone: [phone] Fax: [phone]

## 2019-09-02 ENCOUNTER — Ambulatory Visit: Payer: Medicaid Other | Attending: Plastic Surgery | Admitting: Physical Therapy

## 2019-09-02 ENCOUNTER — Encounter: Payer: Self-pay | Admitting: Physical Therapy

## 2019-09-02 ENCOUNTER — Other Ambulatory Visit: Payer: Self-pay

## 2019-09-02 DIAGNOSIS — M546 Pain in thoracic spine: Secondary | ICD-10-CM | POA: Diagnosis not present

## 2019-09-02 DIAGNOSIS — R2689 Other abnormalities of gait and mobility: Secondary | ICD-10-CM | POA: Diagnosis present

## 2019-09-02 NOTE — Patient Instructions (Signed)
Access Code: [email or web address]: [email or web address]: 08/02/2021Prepared by: Victorino Dike PaaExercises  Supine Posterior Pelvic Tilt - 3 x daily - 7 x weekly - 1 sets - 10 reps - 10 hold  Supine Single Knee to Chest Stretch - 2 x daily - 7 x weekly - 1 sets - 5 reps - 30 hold  Supine Lower Trunk Rotation - 2 x daily - 7 x weekly - 1 sets - 10 reps - 10 hold  Sidelying Thoracic Lumbar Rotation - 2 x daily - 7 x weekly - 1 sets - 5 reps - 30 hold  Standing Hip Flexor Stretch - 1 x daily - 7 x weekly - 1 sets - 3 reps - 30 hold  Standing Bilateral Low Shoulder Row with Anchored Resistance - 1 x daily - 7 x weekly - 2 sets - 10 reps - 5 hold  Supine Shoulder Horizontal Abduction with Resistance - 1 x daily - 7 x weekly - 2 sets - 10 reps - 5 hold

## 2019-09-02 NOTE — Therapy (Signed)
[facility] [address] [place], [place], [address] Phone: [phone]   Fax:  [phone]  Physical Therapy Treatment  Patient Details  Name: Sophia Marks MRN: [id number] Date of Birth: 01-Jun-1980 Referring Provider (PT): Dr. Foster Simpson    Encounter Date: 09/02/2019   PT End of Session - 09/02/19 0750    Visit Number 6    Number of Visits 8    Date for PT Re-Evaluation 09/27/19    Authorization Type UHC Medicaid    PT Start Time 0745    PT Stop Time 0838    PT Time Calculation (min) 53 min    Activity Tolerance Patient tolerated treatment well    Behavior During Therapy [facility] for tasks assessed/performed           Past Medical History:  Diagnosis Date  . Acquired hypothyroidism 12/22/2015  . Back pain 04/05/2019  . Cerebral palsy   . Female stress incontinence   . Generalized anxiety disorder   . Hydrocephalus    Congenital  . Insomnia   . Laryngopharyngeal reflux (LPR) 03/08/2016  . Major depressive disorder   . 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 Neck pain 04/05/2019  . 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 Symptomatic mammary hypertrophy 04/05/2019  . VP (ventriculoperitoneal) shunt status     Past Surgical History:  Procedure Laterality Date  . heal surgery    . VENTRICULOPERITONEAL SHUNT  11/16/2011   Procedure: SHUNT INSERTION VENTRICULAR-PERITONEAL;  Surgeon: Maeola Harman, MD;  Location: [facility];  Service: Neurosurgery;  Laterality: Right;  Ventricular Peritoneal Shunt Insertion/Revision with Laparoscopic Abdominal Approach    There were no vitals filed for this visit.   Subjective Assessment - 09/02/19 0749    Subjective Back is hurting, 4/10.  Typical pain.    Currently in Pain? Yes    Pain Score 4     Pain Location Back    Pain Orientation Mid;Upper;Lower    Pain Descriptors / Indicators Tightness;Sore    Pain Type Chronic  pain    Pain Onset More than a month ago    Pain Frequency Intermittent    Aggravating Factors  stand, walking    Pain Relieving Factors rest, heat helps, stretching is temporary relief    Multiple Pain Sites No                [facility] Adult PT Treatment/Exercise - 09/02/19 0001      Lumbar Exercises: Stretches   Hip Flexor Stretch 2 reps;30 seconds    Pelvic Tilt 15 reps    Pelvic Tilt Limitations max cues over ball for release of lumbar     Quadruped Mid Back Stretch Limitations done in standing with max cues       Lumbar Exercises: Aerobic   Nustep L5 UE and LE for 6 min       Lumbar Exercises: Supine   Bridge Limitations x 10 with horizontal arm pulls x 10      Lumbar Exercises: Sidelying   Other Sidelying Lumbar Exercises upper trunk rotation x 5 each side       Shoulder Exercises: Supine   Horizontal ABduction Strengthening;Both;15 reps    Theraband Level (Shoulder Horizontal ABduction) Level 2 (Red)      Shoulder Exercises: Stretch   Corner Stretch Limitations doorway x 3 x 30 sec       Modalities   Modalities Moist Heat      Moist  Heat Therapy   Number Minutes Moist Heat 10 Minutes    Moist Heat Location Lumbar Spine   thoracic as well                 PT Education - 09/02/19 0825    Education Details spinal mobility, posterior pelvic tilt and HEP, POC    Person(s) Educated Patient    Methods Explanation;Demonstration    Comprehension Verbalized understanding;Returned demonstration;Need further instruction;Verbal cues required;Tactile cues required            PT Short Term Goals - 08/30/19 0754      PT SHORT TERM GOAL #1   Title Pt will be I with initial HEP for LE, back flexibility and posture    Status Achieved      PT SHORT TERM GOAL #2   Title Pt will be able to report less pain with sitting up to an hour at work    Status On-going      PT SHORT TERM GOAL #3   Title Pt will be able to walk with mom 20 min with pain < 3/10.     Baseline 5/10    Status On-going             PT Long Term Goals - 08/02/19 0906      PT LONG TERM GOAL #1   Title Pt will be I with HEP for long term low back care    Time 8    Period Weeks    Status New    Target Date 09/27/19      PT LONG TERM GOAL #2   Title Pt will be able to walk/stand up to 30 min </= 3/10 back pain    Baseline 5/10 or more    Time 8    Period Weeks    Status New    Target Date 09/27/19      PT LONG TERM GOAL #3   Title Pt will be able to lift light sized items (waist hgt to floor) with good body mechanics to preserve low back    Time 8    Period Weeks    Status New    Target Date 09/27/19      PT LONG TERM GOAL #4   Title Pt will be able to show corrected posture for sitting, workstation to reduce stain on neck and upper back.    Time 8    Period Weeks    Status New    Target Date 09/27/19                 Plan - 09/02/19 0751    Clinical Impression Statement Worked on spinal mobility today as this is Shaquala's limiting factor.  Able to use cues that worked for her in supine.  Reviewed HEP and offered medbridge for videos at home.  No pain post session.    PT Treatment/Interventions ADLs/Self Care Home Management;Moist Heat;Functional mobility training;Therapeutic activities;Therapeutic exercise;Patient/family education;Taping;Passive range of motion;Cryotherapy;Manual techniques;Spinal Manipulations    PT Next Visit Plan thoracic mobs, spine mobilty, DC    PT Home Exercise Plan knee to chest, LTR, upper trunk rotation , corner stretch, hip flexor, row, horiz pull.    Consulted and Agree with Plan of Care Patient           Patient will benefit from skilled therapeutic intervention in order to improve the following deficits and impairments:  Abnormal gait, Decreased balance, Decreased mobility, Difficulty walking, Hypomobility, Improper body mechanics, Decreased range of  motion, Pain, Postural dysfunction, Impaired UE functional use,  Impaired flexibility, Decreased strength, Increased fascial restricitons  Visit Diagnosis: Pain in thoracic spine  Other abnormalities of gait and mobility     Problem List Patient Active Problem List   Diagnosis Date Noted  . 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 Major depressive disorder   . Generalized anxiety disorder   . Female stress incontinence   . Insomnia   . Menopausal symptom   . Back pain 04/05/2019  . Neck pain 04/05/2019  . Symptomatic mammary hypertrophy 04/05/2019  . Laryngopharyngeal reflux (LPR) 03/08/2016  . Acquired hypothyroidism 12/22/2015  . 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 Cerebral palsy 05/02/2014  . Shunt malfunction 11/14/2011  . Hydrocephalus (HCC) 11/14/2011    Carlon Chaloux 09/02/2019, 9:36 AM  [facility] [address] [place], [place], [address] Phone: [phone]   Fax:  [phone]  Name: HALYNN REITANO MRN: [id number] Date of Birth: 11/20/1980  Karie Mainland, PT 09/02/19 9:36 AM Phone: [phone] Fax: [phone]

## 2019-09-09 ENCOUNTER — Other Ambulatory Visit: Payer: Self-pay

## 2019-09-09 ENCOUNTER — Ambulatory Visit: Payer: Medicaid Other | Admitting: Physical Therapy

## 2019-09-09 ENCOUNTER — Encounter: Payer: Self-pay | Admitting: Physical Therapy

## 2019-09-09 DIAGNOSIS — R2689 Other abnormalities of gait and mobility: Secondary | ICD-10-CM

## 2019-09-09 DIAGNOSIS — M546 Pain in thoracic spine: Secondary | ICD-10-CM

## 2019-09-09 NOTE — Therapy (Signed)
[place], [place], [address] Phone: [phone]   Fax:  [phone]  Physical Therapy Treatment/Discharge  Patient Details  Name: SHAMIKA PEDREGON MRN: [id number] Date of Birth: Jan 16, 1981 Referring Provider (PT): Dr. Audelia Hives    Encounter Date: 09/09/2019   PT End of Session - 09/09/19 0751    Visit Number 7    Number of Visits 8    Date for PT Re-Evaluation 09/27/19    Authorization Type UHC Medicaid    PT Start Time [phone]    PT Stop Time 0830    PT Time Calculation (min) 44 min    Activity Tolerance Patient tolerated treatment well    Behavior During Therapy [facility] for tasks assessed/performed           Past Medical History:  Diagnosis Date  . Acquired hypothyroidism 12/22/2015  . Back pain 04/05/2019  . Cerebral palsy   . Female stress incontinence   . Generalized anxiety disorder   . Hydrocephalus    Congenital  . Insomnia   . Laryngopharyngeal reflux (LPR) 03/08/2016  . Major depressive disorder   . 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 Neck pain 04/05/2019  . 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 Symptomatic mammary hypertrophy 04/05/2019  . VP (ventriculoperitoneal) shunt status     Past Surgical History:  Procedure Laterality Date  . heal surgery    . VENTRICULOPERITONEAL SHUNT  11/16/2011   Procedure: SHUNT INSERTION VENTRICULAR-PERITONEAL;  Surgeon: Erline Levine, MD;  Location: [place] [facility];  Service: Neurosurgery;  Laterality: Right;  Ventricular Peritoneal Shunt Insertion/Revision with Laparoscopic Abdominal Approach    There were no vitals filed for this visit.   Subjective Assessment - 09/09/19 0749    Subjective I am stiff all over.  I just feel like the stretches give me temporary relief.    Currently in Pain? Yes    Pain Score 5     Pain Location Back    Pain Orientation Upper;Mid;Lower    Pain Descriptors  / Indicators Tightness    Pain Type Chronic pain    Pain Onset More than a month ago    Pain Frequency Intermittent    Aggravating Factors  standing , walking    Pain Relieving Factors heat, rest              [facility] Adult PT Treatment/Exercise - 09/09/19 0001      Self-Care   Self-Care ADL's;Lifting;Posture;Heat/Ice Application;Other Self-Care Comments    Lifting techniques for lifting from floor     Heat/Ice Application for stiffness and pain relief     Other Self-Care Comments  HEP       Lumbar Exercises: Stretches   Single Knee to Chest Stretch Limitations x 2 each side     Double Knee to Chest Stretch Limitations x 1     Lower Trunk Rotation 5 reps;30 seconds    Lower Trunk Rotation Limitations head turns       Lumbar Exercises: Aerobic   UBE (Upper Arm Bike) 5 min L1       Lumbar Exercises: Quadruped   Madcat/Old Horse 10 reps    Other Quadruped Lumbar Exercises childs pose       Shoulder Exercises: Standing   Extension Limitations red x 20     Row Limitations green x 20       Manual Therapy   Joint Mobilization GR I-II along T spine  Soft tissue mobilization parapsinals of thoraicic and lumbar                     PT Short Term Goals - 09/09/19 0752      PT SHORT TERM GOAL #1   Title Pt will be I with initial HEP for LE, back flexibility and posture    Status Achieved      PT SHORT TERM GOAL #2   Title Pt will be able to report less pain with sitting up to an hour at work    Baseline can sit for 4 hours.             PT Long Term Goals - 09/09/19 0810      PT LONG TERM GOAL #1   Title Pt will be I with HEP for long term low back care    Status Achieved      PT LONG TERM GOAL #2   Title Pt will be able to walk/stand up to 30 min </= 3/10 back pain    Baseline 5/10 or more    Status Not Met      PT LONG TERM GOAL #3   Title Pt will be able to lift light sized items (waist hgt to floor) with good body mechanics to preserve low back     Baseline resistant to golfers lift    Status Partially Met      PT LONG TERM GOAL #4   Title Pt will be able to show corrected posture for sitting, workstation to reduce stain on neck and upper back.    Status Achieved                 Plan - 09/09/19 0752    Clinical Impression Statement Patient needed min cues to understand and show I for HEP for spinal mobility.  She has learned to manage her pain but her spasticity with gait will be a limiting factor in her ability maintain benefits of PT.    PT Treatment/Interventions ADLs/Self Care Home Management;Moist Heat;Functional mobility training;Therapeutic activities;Therapeutic exercise;Patient/family education;Taping;Passive range of motion;Cryotherapy;Manual techniques;Spinal Manipulations    PT Next Visit Plan DC , NA    PT Home Exercise Plan knee to chest, LTR, upper trunk rotation , corner stretch, hip flexor, row, horiz pull.    Consulted and Agree with Plan of Care Patient           Patient will benefit from skilled therapeutic intervention in order to improve the following deficits and impairments:  Abnormal gait, Decreased balance, Decreased mobility, Difficulty walking, Hypomobility, Improper body mechanics, Decreased range of motion, Pain, Postural dysfunction, Impaired UE functional use, Impaired flexibility, Decreased strength, Increased fascial restricitons  Visit Diagnosis: Pain in thoracic spine  Other abnormalities of gait and mobility     Problem List Patient Active Problem List   Diagnosis Date Noted  . 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 Major depressive disorder   . Generalized anxiety disorder   . Female stress incontinence   . Insomnia   . Menopausal symptom   . Back pain 04/05/2019  . Neck pain 04/05/2019  . Symptomatic mammary hypertrophy 04/05/2019  . Laryngopharyngeal reflux (LPR) 03/08/2016  . Acquired hypothyroidism 12/22/2015  . Ocular migraine 05/02/2014   . 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 Cerebral palsy 05/02/2014  . Shunt malfunction 11/14/2011  . Hydrocephalus ([place]) 11/14/2011    Salina Stanfield 09/09/2019, 10:36 AM  [facility]  [place], [place], [address] Phone: [phone]   Fax:  [phone]  Name: FATMA RUTTEN MRN: [id number] Date of Birth: 1980/11/25  PHYSICAL THERAPY DISCHARGE SUMMARY  Visits from Start of Care: 8  Current functional level related to goals / functional outcomes: See above    Remaining deficits: Posture, pain, spine stiffness, gait abnormality    Education / Equipment: Extensive: HEP, posture, spasticity, stretching, lifting, ergonomics   Plan: Patient agrees to discharge.  Patient goals were partially met. Patient is being discharged due to                                                     ?????   Maximized functional gains   Raeford Razor, PT 09/09/19 10:36 AM Phone: [phone] Fax: [phone]

## 2019-10-08 ENCOUNTER — Ambulatory Visit (INDEPENDENT_AMBULATORY_CARE_PROVIDER_SITE_OTHER): Payer: Medicaid Other | Admitting: Plastic Surgery

## 2019-10-08 ENCOUNTER — Encounter: Payer: Self-pay | Admitting: Plastic Surgery

## 2019-10-08 ENCOUNTER — Other Ambulatory Visit: Payer: Self-pay

## 2019-10-08 VITALS — BP 125/73 | HR 89 | Temp 97.8°F | Ht 62.0 in | Wt 160.2 lb

## 2019-10-08 DIAGNOSIS — N62 Hypertrophy of breast: Secondary | ICD-10-CM

## 2019-10-08 DIAGNOSIS — M542 Cervicalgia: Secondary | ICD-10-CM | POA: Diagnosis not present

## 2019-10-08 DIAGNOSIS — M546 Pain in thoracic spine: Secondary | ICD-10-CM

## 2019-10-08 DIAGNOSIS — G8929 Other chronic pain: Secondary | ICD-10-CM | POA: Diagnosis not present

## 2019-10-08 NOTE — Progress Notes (Signed)
Patient is here for follow-up after having a consult for a bilateral breast reduction on 04/05/2019 with Dr. Ulice Bold.  She is 5 feet 2 inches tall and at that visit weight 144 pounds.  7 Pt session completed since last visit: 8/9, 8/2, 7/30, 7/21, 7/16, 7/6, and 7/2. Reports PT sessions have not relieved the neck/back pain. She continues to have difficulty exercising. Today her weight is 160.2 lbs.   The 21st Century Cures Act was signed into law in 2016 which includes the topic of electronic health records.  This provides immediate access to information in MyChart.  This includes consultation notes, operative notes, office notes, lab results and pathology reports.  If you have any questions about what you read please let us know at your next visit or call us at the office.  We are right here with you.

## 2019-12-05 ENCOUNTER — Telehealth: Payer: Self-pay | Admitting: Neurology

## 2019-12-05 NOTE — Telephone Encounter (Signed)
Patient states that she is experiencing full body numbness and tingling that started on & off about a month ago, but she is having it every day now (started 1 week ago). She saw her PCP regarding this and they ran bloodwork, but it came back normal, and PCP was recommending she follow up with Dr Everlena Cooper. She also reports that last week she had what felt like "a lightening bolt went all the way up my right leg". Patient is scheduled for a follow up on 05/27/20 and has been added to waitlist. She will request the notes and from her PCP visit yesterday be faxed to our office for Dr Everlena Cooper to review. Please call.  She is going to be traveling in 2 weeks and then she is having plastic surgery on Dec 1.

## 2019-12-05 NOTE — Telephone Encounter (Signed)
Any suggestion

## 2019-12-05 NOTE — Telephone Encounter (Signed)
I don't know.  I have to see her.

## 2019-12-06 NOTE — Telephone Encounter (Signed)
Please add patient to the waiting list. Thank you

## 2019-12-10 NOTE — Progress Notes (Deleted)
NEUROLOGY FOLLOW UP OFFICE NOTE  Sophia Marks [id number]   Subjective:  Sophia Marks is a 39 year old right-handed woman with cerebral pals, /congenital hydrocephalus s/p shunt and ocular migraines and history of optic neuritis who follows up for ***  UPDATE: She underwent neuropsychological testing on 06/18/2019.    HISTORY: Ocular Migraines: She has had ocular migraines since age 53. They present as onset of floaters and squiggly lines in the visual field of either eye.It is associated with unilateral dull non-throbbing headache (above affected eye with aura). There is no associated nausea, vomiting, photophobia, phonophobia or unilateral numbness or weakness.Staring at a computer screen may aggravate it(she works as a Geologist, engineering). Laying down for a couple of minutes help relieve it. The visual aura lasts 2 minutes but the headache lasts 5 to 15 minutes.They occur sporadically. She may have months with no migraine but then they may occur once a month for a few months.  Cognitive Deficits: She was diagnosed with hydrocephalusin her teens.Her neurosurgeon is Dr. Maeola Harman.She had a new shunt placed on 11/16/11 after malfunction and disconnect.Her shunt settings are 61mmH2O pressure.Her shunt is working well.She denies headaches or urinary incontinence.There has been no worsening of gait.However, since the shunt malfunction, she and her family have noted mild change in cognition.Specifically, she seems more slow to respond.When asked a question, she will often have a blank stare for a moment before answering.There is no loss of awareness.Sometimes, when she talks, she knows what she wants to say but has word-finding difficulties.She has been told that she will sometimes repeat herself to other people.She does have stress and some anxiety, especially work-related.She sees a Veterinary surgeon without much difference.She works in  transcription.She has no problems performing her job.She is able to read without losing train of thought, without difficulty understanding what she read, and without need to re-read.She denies difficulty following along when watching TV or a movie.She underwent neuropsychological testing on 12/23/13, which revealed brain dysfunction in multiple cognitive domains, primarily indicative of posterior right hemisphere dysfunction (non-motor visualspatial processing and organizational tests) and more generalized cortical damage (impairment in auditory memory, semantic fluency and dominant hand fine motor skills). 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 It could not be determined if there has been a cognitive decline since shunt revision in 2013.Over the past couple of years, her mother feels that her memory has gotten worse.TSH from 01/18/17 was 2.33.  In 2019, she endorsed worsening memory deficits.  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  She also endorsed panic attacks.  B12 from 08/07/17 was 367. Thyroid testing was normal. She underwent neuropsychological testing on 08/15/17,demonstrating major neurocognitive disorderwhich againrevealeddeficits in visual-spatial construction, memory encoding/retrieval and increased problems. However, compared to prior neurocognitive testing in 2015, she now demonstratesmoderate to severe decline in memory recognition. With the exception of deductive reasoning, executive functioning and attention were within normal limits. Overall profile suggests right parietal and left medial temporal dysfunction.  She had a CT of the head on 09/12/17 to evaluate for recurrence of hydrocephalus, which was personally reviewed, but was stable compared to prior CT from 04/25/2014, demonstrating congenital dysplastic features and stable bilateral CSF  shunts with no ventriculomegaly.  To assess worsening memory, she had an MRI of the brain with and without contrast on 11/30/2017, which was personally reviewed and showed bilateral shunts and chronic periventricular  leukomalacia with volume loss in the bilateral parietal lobes with compensatory enlargement of the atria but no hydrocephalus acute abnormality.  PAST MEDICAL HISTORY: Past Medical History:  Diagnosis Date  . Acquired hypothyroidism 12/22/2015  . Back pain 04/05/2019  . Cerebral palsy   . Female stress incontinence   . Generalized anxiety disorder   . Hydrocephalus    Congenital  . Insomnia   . Laryngopharyngeal reflux (LPR) 03/08/2016  . Major depressive disorder   . 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 Neck pain 04/05/2019  . 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 Symptomatic mammary hypertrophy 04/05/2019  . VP (ventriculoperitoneal) shunt status     MEDICATIONS: Current Outpatient Medications on File Prior to Visit  Medication Sig Dispense Refill  . citalopram (CELEXA) 20 MG tablet Take 20 mg by mouth daily. 1/2 tab once daily    . hydrOXYzine (ATARAX/VISTARIL) 25 MG tablet Take 25 mg by mouth 3 (three) times daily as needed.    Marland Kitchen levothyroxine (SYNTHROID, LEVOTHROID) 50 MCG tablet Take 50 mcg by mouth daily.    . valACYclovir (VALTREX) 500 MG tablet Take 500 mg by mouth as needed. Cold sores     No current facility-administered medications on file prior to visit.    ALLERGIES: No Known Allergies  FAMILY HISTORY: Family History  Problem Relation Age of Onset  . Hypertension Mother   . Depression Maternal Grandfather   . CVA Paternal Grandmother   . Breast cancer Paternal Grandmother   . Ataxia Neg Hx   . Chorea Neg Hx   . Dementia Neg Hx   . Mental retardation Neg Hx   . Migraines Neg Hx   . Multiple sclerosis Neg Hx   . Neurofibromatosis Neg Hx   . Neuropathy Neg Hx   . Parkinsonism Neg Hx    . Seizures Neg Hx   . Stroke Neg Hx    ***.  SOCIAL HISTORY: Social History   Socioeconomic History  . Marital status: Single    Spouse name: Not on file  . Number of children: Not on file  . Years of education: 3  . Highest education level: Bachelor's degree (e.g., BA, AB, BS)  Occupational History  . Not on file  Tobacco Use  . Smoking status: Never Smoker  . Smokeless tobacco: Never Used  Substance and Sexual Activity  . Alcohol use: Yes    Alcohol/week: 0.0 standard drinks    Comment: occasionally  . Drug use: No  . Sexual activity: Not Currently  Other Topics Concern  . Not on file  Social History Narrative   Right handed   One story home    Drinks caffeine   Social Determinants of Health   Financial Resource Strain:   . Difficulty of Paying Living Expenses: Not on file  Food Insecurity:   . Worried About Programme researcher, broadcasting/film/video in the Last Year: Not on file  . Ran Out of Food in the Last Year: Not on file  Transportation Needs:   . Lack of Transportation (Medical): Not on file  . Lack of Transportation (Non-Medical): Not on file  Physical Activity:   . Days of Exercise per Week: Not on file  . Minutes of Exercise per Session: Not on file  Stress:   . Feeling of Stress : Not on file  Social Connections:   . Frequency of Communication with Friends and Family: Not on file  . Frequency of Social Gatherings with Friends  and Family: Not on file  . Attends Religious Services: Not on file  . Active Member of Clubs or Organizations: Not on file  . Attends Banker Meetings: Not on file  . Marital Status: Not on file  Intimate Partner Violence:   . Fear of Current or Ex-Partner: Not on file  . Emotionally Abused: Not on file  . Physically Abused: Not on file  . Sexually Abused: Not on file     Objective:   There were no vitals filed for this visit. General: No acute distress.  Patient appears ***-groomed.   Head:  Normocephalic/atraumatic Eyes:   Fundi examined but not visualized Neck: supple, no paraspinal tenderness, full range of motion Heart:  Regular rate and rhythm Lungs:  Clear to auscultation bilaterally Back: No paraspinal tenderness Neurological Exam: alert and oriented to person, place, and time. Attention span and concentration intact, recent and remote memory intact, fund of knowledge intact.  Speech fluent and not dysarthric, language intact.  CN II-XII intact. Bulk and tone normal, muscle strength 5/5 throughout.  Sensation to light touch, temperature and vibration intact.  Deep tendon reflexes 2+ throughout, toes downgoing.  Finger to nose and heel to shin testing intact.  Gait normal, Romberg negative.   Assessment/Plan:   1.  Numbness and tingling 2.  Hydrocephalus s/p VP shunt 3.  Major neurocognitive disorder 4.  Ocular migraines  Shon Millet, DO  CC: ***

## 2019-12-11 ENCOUNTER — Ambulatory Visit: Payer: Medicaid Other | Admitting: Neurology

## 2019-12-17 ENCOUNTER — Encounter: Payer: Self-pay | Admitting: Surgical

## 2019-12-17 ENCOUNTER — Other Ambulatory Visit: Payer: Self-pay

## 2019-12-17 ENCOUNTER — Ambulatory Visit (INDEPENDENT_AMBULATORY_CARE_PROVIDER_SITE_OTHER): Payer: Medicaid Other | Admitting: Surgical

## 2019-12-17 VITALS — BP 120/75 | HR 81 | Temp 97.6°F | Ht 62.0 in | Wt 155.6 lb

## 2019-12-17 DIAGNOSIS — N62 Hypertrophy of breast: Secondary | ICD-10-CM

## 2019-12-17 DIAGNOSIS — G8929 Other chronic pain: Secondary | ICD-10-CM

## 2019-12-17 DIAGNOSIS — M546 Pain in thoracic spine: Secondary | ICD-10-CM

## 2019-12-17 DIAGNOSIS — M542 Cervicalgia: Secondary | ICD-10-CM

## 2019-12-17 MED ORDER — HYDROCODONE-ACETAMINOPHEN 5-325 MG PO TABS: 1.0000 | ORAL_TABLET | Freq: Four times a day (QID) | ORAL | 0 refills | Status: AC | PRN

## 2019-12-17 MED ORDER — ONDANSETRON HCL 4 MG PO TABS: 4.0000 mg | ORAL_TABLET | Freq: Three times a day (TID) | ORAL | 0 refills | Status: DC | PRN

## 2019-12-17 MED ORDER — CEPHALEXIN 500 MG PO CAPS: 500.0000 mg | ORAL_CAPSULE | Freq: Four times a day (QID) | ORAL | 0 refills | Status: DC

## 2019-12-17 NOTE — Progress Notes (Signed)
Patient ID: Sophia Marks, female    DOB: [birth year], 39 y.o.   MRN: [id number]  Chief Complaint  Patient presents with  . Pre-op Exam      ICD-10-CM   1. Symptomatic mammary hypertrophy  N62   2. Chronic bilateral thoracic back pain  M54.6    G89.29   3. Neck pain  M54.2      History of Present Illness: Sophia Marks is a 39 y.o.  female  with a history of macromastia.  She presents for preoperative evaluation for upcoming procedure, bilateral breast reduction with possible liposuction, scheduled for 01/01/2020 with Dr. Ulice Bold  The patient has not had problems with anesthesia. No history of DVT/PE.  Reports sister had DVT in past while wearing a leg cast, no other family hx of dvt.  No family or personal history of bleeding or clotting disorders.  Patient is not currently taking any blood thinners.  No history of CVA/MI.   Summary of Previous Visit: Breasts are extremely large and fairly symmetric with the left side slightly longer and fuller than the right.  STN on the right is 27 cm and left is 28 cm.  IMF distance is 11 cm.  Preoperative bra size equals 38 DDD cup and patient would like to be a C cup.  Estimated excess tissue to be removed at the time of surgery equals 350 g on the left and 350 g on the right.  Job: Nurse, learning disability   PMH Significant for: Status post ventriculoperitoneal shunt for hydrocephalus, history of cerebral palsy, laryngeal pharyngeal reflux   Past Medical History: Allergies: No Known Allergies  Current Medications:  Current Outpatient Medications:  .  citalopram (CELEXA) 20 MG tablet, Take 20 mg by mouth daily. 1/2 tab once daily, Disp: , Rfl:  .  hydrOXYzine (ATARAX/VISTARIL) 25 MG tablet, Take 25 mg by mouth 3 (three) times daily as needed., Disp: , Rfl:  .  levothyroxine (SYNTHROID, LEVOTHROID) 50 MCG tablet, Take 50 mcg by mouth daily., Disp: , Rfl:  .  valACYclovir (VALTREX) 500 MG tablet, Take 500 mg by mouth as needed. Cold sores,  Disp: , Rfl:   Past Medical Problems: Past Medical History:  Diagnosis Date  . Acquired hypothyroidism 12/22/2015  . Back pain 04/05/2019  . Cerebral palsy   . Female stress incontinence   . Generalized anxiety disorder   . Hydrocephalus    Congenital  . Insomnia   . Laryngopharyngeal reflux (LPR) 03/08/2016  . Major depressive disorder   . 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 Neck pain 04/05/2019  . 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 Symptomatic mammary hypertrophy 04/05/2019  . VP (ventriculoperitoneal) shunt status     Past Surgical History: Past Surgical History:  Procedure Laterality Date  . heal surgery    . VENTRICULOPERITONEAL SHUNT  11/16/2011   Procedure: SHUNT INSERTION VENTRICULAR-PERITONEAL;  Surgeon: Maeola Harman, MD;  Location: [facility];  Service: Neurosurgery;  Laterality: Right;  Ventricular Peritoneal Shunt Insertion/Revision with Laparoscopic Abdominal Approach    Social History: Social History   Socioeconomic History  . Marital status: Single    Spouse name: Not on file  . Number of children: Not on file  . Years of education: 73  . Highest education level: Bachelor's degree (e.g., BA, AB, BS)  Occupational History  . Not on file  Tobacco Use  . Smoking status: Never Smoker  . Smokeless tobacco: Never Used  Substance and Sexual Activity  . Alcohol use: Yes    Alcohol/week: 0.0 standard drinks    Comment: occasionally  . Drug use: No  . Sexual activity: Not Currently  Other Topics Concern  . Not on file  Social History Narrative   Right handed   One story home    Drinks caffeine   Social Determinants of Health   Financial Resource Strain:   . Difficulty of Paying Living Expenses: Not on file  Food Insecurity:   . Worried About Programme researcher, broadcasting/film/video in the Last Year: Not on file  . Ran Out of Food in the Last Year: Not on file  Transportation Needs:   . Lack of  Transportation (Medical): Not on file  . Lack of Transportation (Non-Medical): Not on file  Physical Activity:   . Days of Exercise per Week: Not on file  . Minutes of Exercise per Session: Not on file  Stress:   . Feeling of Stress : Not on file  Social Connections:   . Frequency of Communication with Friends and Family: Not on file  . Frequency of Social Gatherings with Friends and Family: Not on file  . Attends Religious Services: Not on file  . Active Member of Clubs or Organizations: Not on file  . Attends Banker Meetings: Not on file  . Marital Status: Not on file  Intimate Partner Violence:   . Fear of Current or Ex-Partner: Not on file  . Emotionally Abused: Not on file  . Physically Abused: Not on file  . Sexually Abused: Not on file    Family History: Family History  Problem Relation Age of Onset  . Hypertension Mother   . Depression Maternal Grandfather   . CVA Paternal Grandmother   . Breast cancer Paternal Grandmother   . Ataxia Neg Hx   . Chorea Neg Hx   . Dementia Neg Hx   . Mental retardation Neg Hx   . Migraines Neg Hx   . Multiple sclerosis Neg Hx   . Neurofibromatosis Neg Hx   . Neuropathy Neg Hx   . Parkinsonism Neg Hx   . Seizures Neg Hx   . Stroke Neg Hx     Review of Systems: Review of Systems  Constitutional: Negative.   Respiratory: Negative.   Cardiovascular: Negative.   Gastrointestinal: Negative.   Musculoskeletal: Negative.   Neurological: Negative.     Physical Exam: Vital Signs BP 120/75 (BP Location: Right Arm, Patient Position: Sitting, Cuff Size: Large)   Pulse 81   Temp 97.6 F (36.4 C) (Oral)   Ht 5\' 2"  (1.575 m)   Wt 155 lb 9.6 oz (70.6 kg)   SpO2 93%   BMI 28.46 kg/m  Physical Exam Exam conducted with a chaperone present.  Constitutional:      General: She is not in acute distress.    Appearance: Normal appearance. She is not ill-appearing.  HENT:     Head: Normocephalic and atraumatic.  Eyes:      Pupils: Pupils are equal, round Neck:     Musculoskeletal: Normal range of motion.  Cardiovascular:     Rate and Rhythm: Normal rate and regular rhythm.     Pulses: Normal pulses.     Heart sounds: Normal heart sounds. No murmur.  Pulmonary:     Effort: Pulmonary effort is normal. No respiratory distress.     Breath sounds: Normal breath sounds. No wheezing.  Abdominal:     General: Abdomen is flat. There  is no distension.     Palpations: Abdomen is soft.     Tenderness: There is no abdominal tenderness.  Musculoskeletal: Normal range of motion.  Skin:    General: Skin is warm and dry.     Findings: No erythema or rash.  Neurological:     General: No focal deficit present.     Mental Status: She is alert and oriented to person, place, and time. Mental status is at baseline.     Motor: No weakness.  Psychiatric:        Mood and Affect: Mood normal.        Behavior: Behavior normal.    Assessment/Plan: The patient is scheduled for bilateral breast reduction with Dr. Ulice Bold.  Risks, benefits, and alternatives of procedure discussed, questions answered and consent obtained.    Smoking Status: non smoker; Counseling Given? Na Last Mammogram: 01/01/2019 followed by a left breast ultrasound on 07/15/2019; Results: Stable likely benign 6 mm mass involving the upper left breast at 12 o'clock position at approximately 8 cm from the nipple, likely a fibroadenoma.  Caprini Score: 6, high; Risk Factors include: Family history of thrombosis, BMI greater than 25, and length of planned surgery. Recommendation for mechanical and pharmacological prophylaxis. Encourage early ambulation.   Pictures obtained:[email or web address]   Post-op Rx sent to pharmacy: Norco, Zofran, Keflex  Patient was provided with the breast reduction and General Surgical Risk consent document and Pain Medication Agreement prior to their appointment.  They had adequate time to read through the risk consent documents and Pain  Medication Agreement. We also discussed them in person together during this preop appointment. All of their questions were answered to their satisfaction.  Recommended calling if they have any further questions.  Risk consent form and Pain Medication Agreement to be scanned into patient's chart.  The risk that can be encountered with breast reduction were discussed and include the following but not limited to these:  Breast asymmetry, fluid accumulation, firmness of the breast, inability to breast feed, loss of nipple or areola, skin loss, decrease or no nipple sensation, fat necrosis of the breast tissue, bleeding, infection, healing delay.  There are risks of anesthesia, changes to skin sensation and injury to nerves or blood vessels.  The muscle can be temporarily or permanently injured.  You may have an allergic reaction to tape, suture, glue, blood products which can result in skin discoloration, swelling, pain, skin lesions, poor healing.  Any of these can lead to the need for revisonal surgery or stage procedures.  A reduction has potential to interfere with diagnostic procedures.  Nipple or breast piercing can increase risks of infection.  This procedure is best done when the breast is fully developed.  Changes in the breast will continue to occur over time.  Pregnancy can alter the outcomes of previous breast reduction surgery, weight gain and weigh loss can also effect the long term appearance.    Electronically signed by: Kermit Balo Azzan Butler, PA-C 12/17/2019 9:07 AM

## 2019-12-17 NOTE — Progress Notes (Signed)
NEUROLOGY FOLLOW UP OFFICE NOTE  Sophia Marks [id number]   Subjective:  Sophia Marks is a 39 year old right-handed woman with cerebral pals, /congenital hydrocephalus s/p shunt and ocular migraines and history of optic neuritis who follows up for numbness and tingling.  UPDATE: She underwent neuropsychological testing on 06/18/2019, in which she demonstrated some declines across verbal retrieval and learning of unstructured verbal information but also some improvements across executive functioning while other domains were relatively stable.  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  She started experiencing numbness and tingling all over her body about a month ago.  It is constant.  It involves the hands, the thighs, across the abdomen and right temple.  At one point, she noted a pain described as a "lightening bolt" up her left leg.  A couple of days ago, she was at her computer when her vision became blurry and she couldn't focus for 10 minutes.  Different than her ocular migraines.  No associated headache.  PCP checked labs, such as CMP, B12 (371) and thyroid, which were unremarkable.  HISTORY: Ocular Migraines: She has had ocular migraines since age 71. They present as onset of floaters and squiggly lines in the visual field of either eye.It is associated with unilateral dull non-throbbing headache (above affected eye with aura). There is no associated nausea, vomiting, photophobia, phonophobia or unilateral numbness or weakness.Staring at a computer screen may aggravate it(she works as a Pensions consultant). Laying down for a couple of minutes help relieve it. The visual aura lasts 2 minutes but the headache lasts 5 to 15 minutes.They occur sporadically. She may have months with no  migraine but then they may occur once a month for a few months.  Cognitive Deficits: She was diagnosed with hydrocephalusin her teens.Her neurosurgeon is Dr. Erline Levine.She had a new shunt placed on 11/16/11 after malfunction and disconnect.Her shunt settings are 67mmH2O pressure.Her shunt is working well.She denies headaches or urinary incontinence.There has been no worsening of gait.However, since the shunt malfunction, she and her family have noted mild change in cognition.Specifically, she seems more slow to respond.When asked a question, she will often have a blank stare for a moment before answering.There is no loss of awareness.Sometimes, when she talks, she knows what she wants to say but has word-finding difficulties.She has been told that she will sometimes repeat herself to other people.She does have stress and some anxiety, especially work-related.She sees a Social worker without much difference.She works in transcription.She has no problems performing her job.She is able to read without losing train of thought, without difficulty understanding what she read, and without need to re-read.She denies difficulty following along when watching TV or a movie.She underwent neuropsychological testing on 12/23/13, which revealed brain dysfunction in multiple cognitive domains, primarily indicative of posterior right hemisphere dysfunction (non-motor visualspatial processing and organizational tests) and more generalized cortical damage (impairment in auditory memory, semantic fluency and dominant hand fine motor skills). 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 It could not be determined if there has been a cognitive decline since shunt revision in 2013.Over the past couple of years, her mother feels that her memory has gotten worse.TSH from 01/18/17 was 2.33.  In 2019, she endorsed  worsening memory deficits.  She was working  on medical transcription and is having trouble learning a new program.  She also endorsed panic attacks.  B12 from 08/07/17 was 367. Thyroid testing was normal. She underwent neuropsychological testing on 08/15/17,demonstrating major neurocognitive disorderwhich againrevealeddeficits in visual-spatial construction, memory encoding/retrieval and increased problems. However, compared to prior neurocognitive testing in 2015, she now demonstratesmoderate to severe decline in memory recognition. With the exception of deductive reasoning, executive functioning and attention were within normal limits. Overall profile suggests right parietal and left medial temporal dysfunction.  She had a CT of the head on 09/12/17 to evaluate for recurrence of hydrocephalus, which was personally reviewed, but was stable compared to prior CT from 04/25/2014, demonstrating congenital dysplastic features and stable bilateral CSF shunts with no ventriculomegaly.  To assess worsening memory, she had an MRI of the brain with and without contrast on 11/30/2017, which was personally reviewed and showed bilateral shunts and chronic periventricular leukomalacia with volume loss in the bilateral parietal lobes with compensatory enlargement of the atria but no hydrocephalus acute abnormality.  PAST MEDICAL HISTORY: Past Medical History:  Diagnosis Date  . Acquired hypothyroidism 12/22/2015  . Back pain 04/05/2019  . Cerebral palsy   . Female stress incontinence   . Generalized anxiety disorder   . Hydrocephalus    Congenital  . Insomnia   . Laryngopharyngeal reflux (LPR) 03/08/2016  . Major depressive disorder   . 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 Neck pain 04/05/2019  . 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 Symptomatic mammary hypertrophy 04/05/2019  . VP (ventriculoperitoneal) shunt status     MEDICATIONS: Current  Outpatient Medications on File Prior to Visit  Medication Sig Dispense Refill  . citalopram (CELEXA) 20 MG tablet Take 20 mg by mouth daily. 1/2 tab once daily    . hydrOXYzine (ATARAX/VISTARIL) 25 MG tablet Take 25 mg by mouth 3 (three) times daily as needed.    Marland Kitchen levothyroxine (SYNTHROID, LEVOTHROID) 50 MCG tablet Take 50 mcg by mouth daily.    . valACYclovir (VALTREX) 500 MG tablet Take 500 mg by mouth as needed. Cold sores     No current facility-administered medications on file prior to visit.    ALLERGIES: No Known Allergies  FAMILY HISTORY: Family History  Problem Relation Age of Onset  . Hypertension Mother   . Depression Maternal Grandfather   . CVA Paternal Grandmother   . Breast cancer Paternal Grandmother   . Ataxia Neg Hx   . Chorea Neg Hx   . Dementia Neg Hx   . Mental retardation Neg Hx   . Migraines Neg Hx   . Multiple sclerosis Neg Hx   . Neurofibromatosis Neg Hx   . Neuropathy Neg Hx   . Parkinsonism Neg Hx   . Seizures Neg Hx   . Stroke Neg Hx     SOCIAL HISTORY: Social History   Socioeconomic History  . Marital status: Single    Spouse name: Not on file  . Number of children: Not on file  . Years of education: 51  . Highest education level: Bachelor's degree (e.g., BA, AB, BS)  Occupational History  . Not on file  Tobacco Use  . Smoking status: Never Smoker  . Smokeless tobacco: Never Used  Substance and Sexual Activity  . Alcohol use: Yes    Alcohol/week: 0.0 standard drinks    Comment: occasionally  . Drug use: No  . Sexual activity: Not Currently  Other Topics Concern  . Not on  file  Social History Narrative   Right handed   One story home    Drinks caffeine   Social Determinants of Health   Financial Resource Strain:   . Difficulty of Paying Living Expenses: Not on file  Food Insecurity:   . Worried About Charity fundraiser in the Last Year: Not on file  . Ran Out of Food in the Last Year: Not on file  Transportation Needs:    . Lack of Transportation (Medical): Not on file  . Lack of Transportation (Non-Medical): Not on file  Physical Activity:   . Days of Exercise per Week: Not on file  . Minutes of Exercise per Session: Not on file  Stress:   . Feeling of Stress : Not on file  Social Connections:   . Frequency of Communication with Friends and Family: Not on file  . Frequency of Social Gatherings with Friends and Family: Not on file  . Attends Religious Services: Not on file  . Active Member of Clubs or Organizations: Not on file  . Attends Archivist Meetings: Not on file  . Marital Status: Not on file  Intimate Partner Violence:   . Fear of Current or Ex-Partner: Not on file  . Emotionally Abused: Not on file  . Physically Abused: Not on file  . Sexually Abused: Not on file     Objective:   Blood pressure (!) 141/88, pulse 97, height $RemoveBe'5\' 2"'PmFTNYbQB$  (1.575 m), weight 156 lb 12.8 oz (71.1 kg), SpO2 97 %. General: No acute distress.  Patient appears well-groomed.   Head:  Normocephalic/atraumatic Eyes:  Fundi examined but not visualized Neck: supple, no paraspinal tenderness, full range of motion Heart:  Regular rate and rhythm Lungs:  Clear to auscultation bilaterally Back: No paraspinal tenderness Neurological Exam: alert and oriented to person, place, and time. Attention span and concentration intact, recent and remote memory intact, fund of knowledge intact.  Speech fluent and not dysarthric, language intact.  CN II-XII intact. Bulk and tone normal, muscle strength 5/5 throughout.  Sensation to light touch, temperature and vibration intact.  Deep tendon reflexes 2+ throughout, toes downgoing.  Finger to nose and heel to shin testing intact.  Gait normal, Romberg negative.   Assessment/Plan:   1.  Numbness and tingling 2.  Hydrocephalus s/p VP shunt 3.  Mild neurocognitive disorder   1.  Check NCV-EMG.  If unremarkable, would check MRI. 2.  Further recommendations pending results.  Metta Clines, DO  CC: Marda Stalker, PA-C

## 2019-12-17 NOTE — H&P (View-Only) (Signed)
Patient ID: Sophia Marks, female    DOB: [birth year], 39 y.o.   MRN: [id number]  Chief Complaint  Patient presents with  . Pre-op Exam      ICD-10-CM   1. Symptomatic mammary hypertrophy  N62   2. Chronic bilateral thoracic back pain  M54.6    G89.29   3. Neck pain  M54.2      History of Present Illness: Sophia Marks is a 39 y.o.  female  with a history of macromastia.  She presents for preoperative evaluation for upcoming procedure, bilateral breast reduction with possible liposuction, scheduled for 01/01/2020 with Dr. Ulice Bold  The patient has not had problems with anesthesia. No history of DVT/PE.  Reports sister had DVT in past while wearing a leg cast, no other family hx of dvt.  No family or personal history of bleeding or clotting disorders.  Patient is not currently taking any blood thinners.  No history of CVA/MI.   Summary of Previous Visit: Breasts are extremely large and fairly symmetric with the left side slightly longer and fuller than the right.  STN on the right is 27 cm and left is 28 cm.  IMF distance is 11 cm.  Preoperative bra size equals 38 DDD cup and patient would like to be a C cup.  Estimated excess tissue to be removed at the time of surgery equals 350 g on the left and 350 g on the right.  Job: Nurse, learning disability   PMH Significant for: Status post ventriculoperitoneal shunt for hydrocephalus, history of cerebral palsy, laryngeal pharyngeal reflux   Past Medical History: Allergies: No Known Allergies  Current Medications:  Current Outpatient Medications:  .  citalopram (CELEXA) 20 MG tablet, Take 20 mg by mouth daily. 1/2 tab once daily, Disp: , Rfl:  .  hydrOXYzine (ATARAX/VISTARIL) 25 MG tablet, Take 25 mg by mouth 3 (three) times daily as needed., Disp: , Rfl:  .  levothyroxine (SYNTHROID, LEVOTHROID) 50 MCG tablet, Take 50 mcg by mouth daily., Disp: , Rfl:  .  valACYclovir (VALTREX) 500 MG tablet, Take 500 mg by mouth as needed. Cold sores,  Disp: , Rfl:   Past Medical Problems: Past Medical History:  Diagnosis Date  . Acquired hypothyroidism 12/22/2015  . Back pain 04/05/2019  . Cerebral palsy   . Female stress incontinence   . Generalized anxiety disorder   . Hydrocephalus    Congenital  . Insomnia   . Laryngopharyngeal reflux (LPR) 03/08/2016  . Major depressive disorder   . 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 Neck pain 04/05/2019  . 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 Symptomatic mammary hypertrophy 04/05/2019  . VP (ventriculoperitoneal) shunt status     Past Surgical History: Past Surgical History:  Procedure Laterality Date  . heal surgery    . VENTRICULOPERITONEAL SHUNT  11/16/2011   Procedure: SHUNT INSERTION VENTRICULAR-PERITONEAL;  Surgeon: Maeola Harman, MD;  Location: [facility];  Service: Neurosurgery;  Laterality: Right;  Ventricular Peritoneal Shunt Insertion/Revision with Laparoscopic Abdominal Approach    Social History: Social History   Socioeconomic History  . Marital status: Single    Spouse name: Not on file  . Number of children: Not on file  . Years of education: 73  . Highest education level: Bachelor's degree (e.g., BA, AB, BS)  Occupational History  . Not on file  Tobacco Use  . Smoking status: Never Smoker  . Smokeless tobacco: Never Used  Substance and Sexual Activity  . Alcohol use: Yes    Alcohol/week: 0.0 standard drinks    Comment: occasionally  . Drug use: No  . Sexual activity: Not Currently  Other Topics Concern  . Not on file  Social History Narrative   Right handed   One story home    Drinks caffeine   Social Determinants of Health   Financial Resource Strain:   . Difficulty of Paying Living Expenses: Not on file  Food Insecurity:   . Worried About Programme researcher, broadcasting/film/video in the Last Year: Not on file  . Ran Out of Food in the Last Year: Not on file  Transportation Needs:   . Lack of  Transportation (Medical): Not on file  . Lack of Transportation (Non-Medical): Not on file  Physical Activity:   . Days of Exercise per Week: Not on file  . Minutes of Exercise per Session: Not on file  Stress:   . Feeling of Stress : Not on file  Social Connections:   . Frequency of Communication with Friends and Family: Not on file  . Frequency of Social Gatherings with Friends and Family: Not on file  . Attends Religious Services: Not on file  . Active Member of Clubs or Organizations: Not on file  . Attends Banker Meetings: Not on file  . Marital Status: Not on file  Intimate Partner Violence:   . Fear of Current or Ex-Partner: Not on file  . Emotionally Abused: Not on file  . Physically Abused: Not on file  . Sexually Abused: Not on file    Family History: Family History  Problem Relation Age of Onset  . Hypertension Mother   . Depression Maternal Grandfather   . CVA Paternal Grandmother   . Breast cancer Paternal Grandmother   . Ataxia Neg Hx   . Chorea Neg Hx   . Dementia Neg Hx   . Mental retardation Neg Hx   . Migraines Neg Hx   . Multiple sclerosis Neg Hx   . Neurofibromatosis Neg Hx   . Neuropathy Neg Hx   . Parkinsonism Neg Hx   . Seizures Neg Hx   . Stroke Neg Hx     Review of Systems: Review of Systems  Constitutional: Negative.   Respiratory: Negative.   Cardiovascular: Negative.   Gastrointestinal: Negative.   Musculoskeletal: Negative.   Neurological: Negative.     Physical Exam: Vital Signs BP 120/75 (BP Location: Right Arm, Patient Position: Sitting, Cuff Size: Large)   Pulse 81   Temp 97.6 F (36.4 C) (Oral)   Ht 5\' 2"  (1.575 m)   Wt 155 lb 9.6 oz (70.6 kg)   SpO2 93%   BMI 28.46 kg/m  Physical Exam Exam conducted with a chaperone present.  Constitutional:      General: She is not in acute distress.    Appearance: Normal appearance. She is not ill-appearing.  HENT:     Head: Normocephalic and atraumatic.  Eyes:      Pupils: Pupils are equal, round Neck:     Musculoskeletal: Normal range of motion.  Cardiovascular:     Rate and Rhythm: Normal rate and regular rhythm.     Pulses: Normal pulses.     Heart sounds: Normal heart sounds. No murmur.  Pulmonary:     Effort: Pulmonary effort is normal. No respiratory distress.     Breath sounds: Normal breath sounds. No wheezing.  Abdominal:     General: Abdomen is flat. There  is no distension.     Palpations: Abdomen is soft.     Tenderness: There is no abdominal tenderness.  Musculoskeletal: Normal range of motion.  Skin:    General: Skin is warm and dry.     Findings: No erythema or rash.  Neurological:     General: No focal deficit present.     Mental Status: She is alert and oriented to person, place, and time. Mental status is at baseline.     Motor: No weakness.  Psychiatric:        Mood and Affect: Mood normal.        Behavior: Behavior normal.    Assessment/Plan: The patient is scheduled for bilateral breast reduction with Dr. Ulice Bold.  Risks, benefits, and alternatives of procedure discussed, questions answered and consent obtained.    Smoking Status: non smoker; Counseling Given? Na Last Mammogram: 01/01/2019 followed by a left breast ultrasound on 07/15/2019; Results: Stable likely benign 6 mm mass involving the upper left breast at 12 o'clock position at approximately 8 cm from the nipple, likely a fibroadenoma.  Caprini Score: 6, high; Risk Factors include: Family history of thrombosis, BMI greater than 25, and length of planned surgery. Recommendation for mechanical and pharmacological prophylaxis. Encourage early ambulation.   Pictures obtained:[email or web address]   Post-op Rx sent to pharmacy: Norco, Zofran, Keflex  Patient was provided with the breast reduction and General Surgical Risk consent document and Pain Medication Agreement prior to their appointment.  They had adequate time to read through the risk consent documents and Pain  Medication Agreement. We also discussed them in person together during this preop appointment. All of their questions were answered to their satisfaction.  Recommended calling if they have any further questions.  Risk consent form and Pain Medication Agreement to be scanned into patient's chart.  The risk that can be encountered with breast reduction were discussed and include the following but not limited to these:  Breast asymmetry, fluid accumulation, firmness of the breast, inability to breast feed, loss of nipple or areola, skin loss, decrease or no nipple sensation, fat necrosis of the breast tissue, bleeding, infection, healing delay.  There are risks of anesthesia, changes to skin sensation and injury to nerves or blood vessels.  The muscle can be temporarily or permanently injured.  You may have an allergic reaction to tape, suture, glue, blood products which can result in skin discoloration, swelling, pain, skin lesions, poor healing.  Any of these can lead to the need for revisonal surgery or stage procedures.  A reduction has potential to interfere with diagnostic procedures.  Nipple or breast piercing can increase risks of infection.  This procedure is best done when the breast is fully developed.  Changes in the breast will continue to occur over time.  Pregnancy can alter the outcomes of previous breast reduction surgery, weight gain and weigh loss can also effect the long term appearance.    Electronically signed by: Kermit Balo Kristia Jupiter, PA-C 12/17/2019 9:07 AM

## 2019-12-18 ENCOUNTER — Encounter: Payer: Self-pay | Admitting: Neurology

## 2019-12-18 ENCOUNTER — Ambulatory Visit (INDEPENDENT_AMBULATORY_CARE_PROVIDER_SITE_OTHER): Payer: Medicaid Other | Admitting: Neurology

## 2019-12-18 VITALS — BP 141/88 | HR 97 | Ht 62.0 in | Wt 156.8 lb

## 2019-12-18 DIAGNOSIS — G3184 Mild cognitive impairment, so stated: Secondary | ICD-10-CM | POA: Diagnosis not present

## 2019-12-18 DIAGNOSIS — R200 Anesthesia of skin: Secondary | ICD-10-CM | POA: Diagnosis not present

## 2019-12-18 DIAGNOSIS — R202 Paresthesia of skin: Secondary | ICD-10-CM

## 2019-12-18 DIAGNOSIS — F0670 Mild neurocognitive disorder due to known physiological condition without behavioral disturbance: Secondary | ICD-10-CM

## 2019-12-18 DIAGNOSIS — G919 Hydrocephalus, unspecified: Secondary | ICD-10-CM

## 2019-12-18 NOTE — Patient Instructions (Signed)
Nerve conduction study left arm and leg Further recommendations pending results.

## 2019-12-27 NOTE — Progress Notes (Signed)
Notified Dr. Gus Height about needing orders for upcoming PAT appointment.

## 2019-12-27 NOTE — Progress Notes (Signed)
[id number] [place], [place] - [address] [place] [address] Phone: [phone] Fax: [phone]      Your procedure is scheduled on 01/01/20.  Report to [facility] Main Entrance "A" at 06:30 A.M., and check in at the Admitting office.  Call this number if you have problems the morning of surgery:  [phone]  Call [phone] if you have any questions prior to your surgery date Monday-Friday 8am-4pm    Remember:  Do not eat after midnight the night before your surgery  You may drink clear liquids until 05:30am the morning of your surgery.   Clear liquids allowed are: Water, Non-Citrus Juices (without pulp), Carbonated Beverages, Clear Tea, Black Coffee Only, and Gatorade    Take these medicines the morning of surgery with A SIP OF WATER  citalopram (CELEXA)  levothyroxine (SYNTHROID, LEVOTHROID)  Take these medicines the morning of surgery if needed:  acetaminophen (TYLENOL) hydrOXYzine (ATARAX/VISTARIL) ondansetron (ZOFRAN) valACYclovir (VALTREX)  As of today, STOP taking any Aspirin (unless otherwise instructed by your surgeon) Aleve, Naproxen, Ibuprofen, Motrin, Advil, Goody's, BC's, all herbal medications, fish oil, and all vitamins.                      Do not wear jewelry, make up, or nail polish            Do not wear lotions, powders, perfumes/colognes, or deodorant.            Do not shave 48 hours prior to surgery.              Do not bring valuables to the hospital.            [facility] is not responsible for any belongings or valuables.  Do NOT Smoke (Tobacco/Vaping) or drink Alcohol 24 hours prior to your procedure If you use a CPAP at night, you may bring all equipment for your overnight stay.   Contacts, glasses, dentures or bridgework may not be worn into surgery.      For patients admitted to the hospital, discharge time will be determined by your treatment team.   Patients discharged the day of surgery  will not be allowed to drive home, and someone needs to stay with them for 24 hours.    Special instructions:   [facility]- Preparing For Surgery  Before surgery, you can play an important role. Because skin is not sterile, your skin needs to be as free of germs as possible. You can reduce the number of germs on your skin by washing with CHG (chlorahexidine gluconate) Soap before surgery.  CHG is an antiseptic cleaner which kills germs and bonds with the skin to continue killing germs even after washing.    Oral Hygiene is also important to reduce your risk of infection.  Remember - BRUSH YOUR TEETH THE MORNING OF SURGERY WITH YOUR REGULAR TOOTHPASTE  Please do not use if you have an allergy to CHG or antibacterial soaps. If your skin becomes reddened/irritated stop using the CHG.  Do not shave (including legs and underarms) for at least 48 hours prior to first CHG shower. It is OK to shave your face.  Please follow these instructions carefully.   1. Shower the NIGHT BEFORE SURGERY and the MORNING OF SURGERY with CHG Soap.   2. If you chose to wash your hair, wash your hair first as usual with your normal shampoo.  3. After you shampoo, rinse your hair and body thoroughly  to remove the shampoo.  4. Use CHG as you would any other liquid soap. You can apply CHG directly to the skin and wash gently with a scrungie or a clean washcloth.   5. Apply the CHG Soap to your body ONLY FROM THE NECK DOWN.  Do not use on open wounds or open sores. Avoid contact with your eyes, ears, mouth and genitals (private parts). Wash Face and genitals (private parts)  with your normal soap.   6. Wash thoroughly, paying special attention to the area where your surgery will be performed.  7. Thoroughly rinse your body with warm water from the neck down.  8. DO NOT shower/wash with your normal soap after using and rinsing off the CHG Soap.  9. Pat yourself dry with a CLEAN TOWEL.  10. Wear CLEAN PAJAMAS to  bed the night before surgery  11. Place CLEAN SHEETS on your bed the night of your first shower and DO NOT SLEEP WITH PETS.   Day of Surgery: Wear Clean/Comfortable clothing the morning of surgery Do not apply any deodorants/lotions.   Remember to brush your teeth WITH YOUR REGULAR TOOTHPASTE.   Please read over the following fact sheets that you were given.

## 2019-12-28 ENCOUNTER — Other Ambulatory Visit (HOSPITAL_COMMUNITY)
Admission: RE | Admit: 2019-12-28 | Discharge: 2019-12-28 | Disposition: A | Discharge status: Home / Self Care | Payer: Medicaid Other | Source: Ambulatory Visit | Attending: Plastic Surgery | Admitting: Plastic Surgery

## 2019-12-28 DIAGNOSIS — Z20822 Contact with and (suspected) exposure to covid-19: Secondary | ICD-10-CM | POA: Diagnosis not present

## 2019-12-28 DIAGNOSIS — Z01812 Encounter for preprocedural laboratory examination: Secondary | ICD-10-CM | POA: Insufficient documentation

## 2019-12-29 LAB — SARS CORONAVIRUS 2 (TAT 6-24 HRS): SARS Coronavirus 2: NEGATIVE

## 2019-12-30 ENCOUNTER — Other Ambulatory Visit: Payer: Self-pay

## 2019-12-30 ENCOUNTER — Encounter (HOSPITAL_COMMUNITY): Payer: Self-pay

## 2019-12-30 ENCOUNTER — Encounter (HOSPITAL_COMMUNITY)
Admission: RE | Admit: 2019-12-30 | Discharge: 2019-12-30 | Disposition: A | Discharge status: Home / Self Care | Payer: Medicaid Other | Source: Ambulatory Visit | Attending: Plastic Surgery | Admitting: Plastic Surgery

## 2019-12-30 DIAGNOSIS — Z01812 Encounter for preprocedural laboratory examination: Secondary | ICD-10-CM | POA: Diagnosis present

## 2019-12-30 LAB — CBC
HCT: 45.4 % (ref 36.0–46.0)
Hemoglobin: 14.9 g/dL (ref 12.0–15.0)
MCH: 30.3 pg (ref 26.0–34.0)
MCHC: 32.8 g/dL (ref 30.0–36.0)
MCV: 92.3 fL (ref 80.0–100.0)
Platelets: 331 10*3/uL (ref 150–400)
RBC: 4.92 MIL/uL (ref 3.87–5.11)
RDW: 12.4 % (ref 11.5–15.5)
WBC: 9.9 10*3/uL (ref 4.0–10.5)
nRBC: 0 % (ref 0.0–0.2)

## 2019-12-30 NOTE — Progress Notes (Addendum)
PCP - Jarrett Soho Cardiologist - denies  PPM/ICD - denies    Chest x-ray - n/a EKG - n/a Stress Test - denies ECHO - denies Cardiac Cath - denies  Sleep Study - denies   Patient instructed to hold all Aspirin, NSAID's, herbal medications, fish oil and vitamins 7 days prior to surgery.   ERAS Protcol -yes   COVID TEST- 12/28/2019 (completed)   Anesthesia review: no  Patient denies shortness of breath, fever, cough and chest pain at PAT appointment   All instructions explained to the patient, with a verbal understanding of the material. Patient agrees to go over the instructions while at home for a better understanding. Patient also instructed to self quarantine after being tested for COVID-19. The opportunity to ask questions was provided.

## 2019-12-31 ENCOUNTER — Ambulatory Visit (INDEPENDENT_AMBULATORY_CARE_PROVIDER_SITE_OTHER): Payer: Medicaid Other | Admitting: Neurology

## 2019-12-31 DIAGNOSIS — R202 Paresthesia of skin: Secondary | ICD-10-CM | POA: Diagnosis not present

## 2019-12-31 DIAGNOSIS — R200 Anesthesia of skin: Secondary | ICD-10-CM

## 2019-12-31 NOTE — Procedures (Signed)
[facility]  [address] [place], [address]  [place], [place] [address] Tel: [phone] Fax:  [phone] Test Date:  12/31/2019  Patient: Sophia Marks DOB: Jan 24, 1981 Physician: Nita Sickle, DO  Sex: Female Height: 5\' 2"  Ref Phys: , D.O.  ID#: Shon Millet   Technician:    Patient Complaints: This is a 39 year old female with history of cerebral palsy affecting the left side referred for evaluation of left side generalized numbness and tingling.  NCV & EMG Findings: Extensive electrodiagnostic testing of the left upper and lower extremity shows:  1. All sensory responses including the left median, ulnar, mixed palmar, sural, and superficial peroneal nerves are within normal limits. 2. All motor responses including the left median, ulnar, peroneal, and tibial nerves are within normal limits.  Of note, there is evidence of an accessory peroneal nerve, a normal anatomic variant. 3. There is no evidence of active or chronic motor axonal loss changes affecting any of the tested muscles.  In the left lower extremity, there is an incomplete pattern of motor unit recruitment due to central disorder of motor neuron control.  Impression: This is a normal study the left upper and lower extremities.  In particular, there is no evidence of a sensorimotor polyneuropathy or cervical/lumbosacral radiculopathy.  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   ___________________________ 46, DO    Nerve Conduction Studies Anti Sensory Summary Table   Stim Site NR Peak (ms) Norm Peak (ms) P-T Amp (V) Norm P-T Amp  Left Median Anti Sensory (2nd Digit)  Wrist    2.4 <3.4 68.3 >20  Left Sup Peroneal Anti Sensory (Ant Lat Mall)  33C  12 cm    2.6 <4.5 18.5 >5  Left Sural Anti Sensory (Lat Mall)  33C  Calf    3.0 <4.5 60.4 >5  Left Ulnar Anti Sensory (5th  Digit)  33C  Wrist    2.2 <3.1 57.4 >12   Motor Summary Table   Stim Site NR Onset (ms) Norm Onset (ms) O-P Amp (mV) Norm O-P Amp Site1 Site2 Delta-0 (ms) Dist (cm) Vel (m/s) Norm Vel (m/s)  Left Median Motor (Abd Poll Brev)  33C  Wrist    2.6 <3.9 8.5 >6 Elbow Wrist 3.8 26.0 68 >50  Elbow    6.4  7.5         Left Peroneal Motor (Ext Dig Brev)  33C  Ankle    2.7 <5.5 3.5 >3 B Fib Ankle 6.9 32.0 46 >40  B Fib    9.6  7.6  Poplt B Fib 1.4 8.0 57 >40  Poplt    11.0  7.3         Medial malleolus    3.6  11.7         Left Tibial Motor (Abd Hall Brev)  33C  Ankle    4.6 <6.0 22.0 >8 Knee Ankle 6.5 34.0 52 >40  Knee    11.1  17.0         Left Ulnar Motor (Abd Dig Minimi)  33C  Wrist    2.1 <3.1 7.3 >7 B Elbow Wrist 2.9 19.0 66 >50  B Elbow    5.0  7.1  A Elbow B Elbow 1.4 10.0 71 >50  A Elbow    6.4  6.9          Comparison Summary Table   Stim  Site NR Peak (ms) Norm Peak (ms) P-T Amp (V) Site1 Site2 Delta-P (ms) Norm Delta (ms)  Left Median/Ulnar Palm Comparison (Wrist - 8cm)  33C  Median Palm    1.3 <2.2 132.5 Median Palm Ulnar Palm 0.1   Ulnar Palm    1.4 <2.2 30.4       H Reflex Studies   NR H-Lat (ms) Lat Norm (ms) L-R H-Lat (ms)  Left Tibial (Gastroc)  33C     29.66 <35    EMG   Side Muscle Ins Act Fibs Psw Fasc Number Recrt Dur Dur. Amp Amp. Poly Poly. Comment  Left AntTibialis Nml Nml Nml Nml 3- Mod-V Nml Nml Nml Nml Nml Nml N/A  Left Gastroc Nml Nml Nml Nml 2- Mod-V Nml Nml Nml Nml Nml Nml N/A  Left Flex Dig Long Nml Nml Nml Nml 2- Mod-V Nml Nml Nml Nml Nml Nml N/A  Left GluteusMed Nml Nml Nml Nml Nml Nml Nml Nml Nml Nml Nml Nml N/A  Left RectFemoris Nml Nml Nml Nml Nml Nml Nml Nml Nml Nml Nml Nml N/A  Left 1stDorInt Nml Nml Nml Nml Nml Nml Nml Nml Nml Nml Nml Nml N/A  Left PronatorTeres Nml Nml Nml Nml Nml Nml Nml Nml Nml Nml Nml Nml N/A  Left Biceps Nml Nml Nml Nml Nml Nml Nml Nml Nml Nml Nml Nml N/A  Left Triceps Nml Nml Nml Nml Nml Nml Nml Nml Nml Nml Nml  Nml N/A  Left Deltoid Nml Nml Nml Nml Nml Nml Nml Nml Nml Nml Nml Nml N/A      Waveforms:

## 2020-01-01 ENCOUNTER — Ambulatory Visit (HOSPITAL_COMMUNITY)
Admission: RE | Admit: 2020-01-01 | Discharge: 2020-01-01 | Disposition: A | Discharge status: Home / Self Care | Payer: Medicaid Other | Attending: Plastic Surgery | Admitting: Plastic Surgery

## 2020-01-01 ENCOUNTER — Encounter (HOSPITAL_COMMUNITY)
Admission: RE | Disposition: A | Discharge status: Home / Self Care | Payer: Self-pay | Source: Home / Self Care | Attending: Plastic Surgery

## 2020-01-01 ENCOUNTER — Other Ambulatory Visit: Payer: Self-pay

## 2020-01-01 ENCOUNTER — Encounter (HOSPITAL_COMMUNITY): Payer: Self-pay | Admitting: Plastic Surgery

## 2020-01-01 ENCOUNTER — Ambulatory Visit (HOSPITAL_COMMUNITY): Payer: Medicaid Other | Admitting: Anesthesiology

## 2020-01-01 DIAGNOSIS — N6012 Diffuse cystic mastopathy of left breast: Secondary | ICD-10-CM | POA: Insufficient documentation

## 2020-01-01 DIAGNOSIS — Z79890 Hormone replacement therapy (postmenopausal): Secondary | ICD-10-CM | POA: Diagnosis not present

## 2020-01-01 DIAGNOSIS — M549 Dorsalgia, unspecified: Secondary | ICD-10-CM | POA: Diagnosis not present

## 2020-01-01 DIAGNOSIS — Z79899 Other long term (current) drug therapy: Secondary | ICD-10-CM | POA: Diagnosis not present

## 2020-01-01 DIAGNOSIS — M542 Cervicalgia: Secondary | ICD-10-CM | POA: Diagnosis not present

## 2020-01-01 DIAGNOSIS — M545 Low back pain, unspecified: Secondary | ICD-10-CM | POA: Diagnosis not present

## 2020-01-01 DIAGNOSIS — N62 Hypertrophy of breast: Secondary | ICD-10-CM

## 2020-01-01 DIAGNOSIS — M954 Acquired deformity of chest and rib: Secondary | ICD-10-CM | POA: Diagnosis not present

## 2020-01-01 DIAGNOSIS — N6011 Diffuse cystic mastopathy of right breast: Secondary | ICD-10-CM | POA: Insufficient documentation

## 2020-01-01 HISTORY — PX: BREAST REDUCTION SURGERY: SHX8

## 2020-01-01 LAB — POCT PREGNANCY, URINE: Preg Test, Ur: NEGATIVE

## 2020-01-01 SURGERY — MAMMOPLASTY, REDUCTION
Anesthesia: General | Site: Breast | Laterality: Bilateral

## 2020-01-01 MED ORDER — SUFENTANIL CITRATE 50 MCG/ML IV SOLN
INTRAVENOUS | Status: AC
  Filled 2020-01-01: qty 1

## 2020-01-01 MED ORDER — LIDOCAINE HCL 1 % IJ SOLN
INTRAMUSCULAR | Status: DC | PRN
  Administered 2020-01-01: 30 mL

## 2020-01-01 MED ORDER — SODIUM CHLORIDE 0.9 % IV SOLN: 250.0000 mL | INTRAVENOUS | Status: DC | PRN

## 2020-01-01 MED ORDER — FENTANYL CITRATE (PF) 100 MCG/2ML IJ SOLN: 25.0000 ug | INTRAMUSCULAR | Status: DC | PRN

## 2020-01-01 MED ORDER — BUPIVACAINE-EPINEPHRINE (PF) 0.25% -1:200000 IJ SOLN
INTRAMUSCULAR | Status: AC
  Filled 2020-01-01: qty 30

## 2020-01-01 MED ORDER — ONDANSETRON HCL 4 MG/2ML IJ SOLN
INTRAMUSCULAR | Status: AC
  Filled 2020-01-01: qty 2

## 2020-01-01 MED ORDER — ROCURONIUM BROMIDE 10 MG/ML (PF) SYRINGE
PREFILLED_SYRINGE | INTRAVENOUS | Status: AC
  Filled 2020-01-01: qty 10

## 2020-01-01 MED ORDER — SUGAMMADEX SODIUM 200 MG/2ML IV SOLN
INTRAVENOUS | Status: DC | PRN
  Administered 2020-01-01: 200 mg via INTRAVENOUS

## 2020-01-01 MED ORDER — SODIUM CHLORIDE (PF) 0.9 % IJ SOLN
INTRAMUSCULAR | Status: AC
  Filled 2020-01-01: qty 10

## 2020-01-01 MED ORDER — PROPOFOL 10 MG/ML IV BOLUS
INTRAVENOUS | Status: DC | PRN
  Administered 2020-01-01: 160 mg via INTRAVENOUS
  Administered 2020-01-01: 40 mg via INTRAVENOUS

## 2020-01-01 MED ORDER — EPHEDRINE 5 MG/ML INJ
INTRAVENOUS | Status: AC
  Filled 2020-01-01: qty 10

## 2020-01-01 MED ORDER — LIDOCAINE HCL (PF) 2 % IJ SOLN
INTRAMUSCULAR | Status: AC
  Filled 2020-01-01: qty 5

## 2020-01-01 MED ORDER — ROCURONIUM 10MG/ML (10ML) SYRINGE FOR MEDFUSION PUMP - OPTIME
INTRAVENOUS | Status: DC | PRN
  Administered 2020-01-01: 100 mg via INTRAVENOUS

## 2020-01-01 MED ORDER — LACTATED RINGERS IV SOLN: INTRAVENOUS | Status: DC | PRN

## 2020-01-01 MED ORDER — BUPIVACAINE-EPINEPHRINE 0.25% -1:200000 IJ SOLN
INTRAMUSCULAR | Status: DC | PRN
  Administered 2020-01-01: 30 mL

## 2020-01-01 MED ORDER — ACETAMINOPHEN 650 MG RE SUPP: 650.0000 mg | RECTAL | Status: DC | PRN

## 2020-01-01 MED ORDER — ACETAMINOPHEN 10 MG/ML IV SOLN
INTRAVENOUS | Status: AC
  Filled 2020-01-01: qty 100

## 2020-01-01 MED ORDER — CEFAZOLIN SODIUM-DEXTROSE 2-4 GM/100ML-% IV SOLN
2.0000 g | INTRAVENOUS | Status: AC
  Administered 2020-01-01: 2 g via INTRAVENOUS
  Filled 2020-01-01: qty 100

## 2020-01-01 MED ORDER — SODIUM CHLORIDE 0.9% FLUSH: 3.0000 mL | Freq: Two times a day (BID) | INTRAVENOUS | Status: DC

## 2020-01-01 MED ORDER — OXYCODONE HCL 5 MG PO TABS: 5.0000 mg | ORAL_TABLET | ORAL | Status: DC | PRN

## 2020-01-01 MED ORDER — CHLORHEXIDINE GLUCONATE 0.12 % MT SOLN
15.0000 mL | Freq: Once | OROMUCOSAL | Status: AC
  Administered 2020-01-01: 15 mL via OROMUCOSAL
  Filled 2020-01-01: qty 15

## 2020-01-01 MED ORDER — DEXAMETHASONE SODIUM PHOSPHATE 10 MG/ML IJ SOLN
INTRAMUSCULAR | Status: AC
  Filled 2020-01-01: qty 1

## 2020-01-01 MED ORDER — SUFENTANIL CITRATE 50 MCG/ML IV SOLN
INTRAVENOUS | Status: DC | PRN
  Administered 2020-01-01 (×3): 10 ug via INTRAVENOUS

## 2020-01-01 MED ORDER — PROPOFOL 10 MG/ML IV BOLUS
INTRAVENOUS | Status: AC
  Filled 2020-01-01: qty 20

## 2020-01-01 MED ORDER — OXYCODONE HCL 5 MG PO TABS: 5.0000 mg | ORAL_TABLET | Freq: Once | ORAL | Status: DC | PRN

## 2020-01-01 MED ORDER — SODIUM CHLORIDE 0.9% FLUSH: 3.0000 mL | INTRAVENOUS | Status: DC | PRN

## 2020-01-01 MED ORDER — DEXAMETHASONE SODIUM PHOSPHATE 10 MG/ML IJ SOLN
INTRAMUSCULAR | Status: DC | PRN
  Administered 2020-01-01: 10 mg via INTRAVENOUS

## 2020-01-01 MED ORDER — CHLORHEXIDINE GLUCONATE CLOTH 2 % EX PADS: 6.0000 | MEDICATED_PAD | Freq: Once | CUTANEOUS | Status: DC

## 2020-01-01 MED ORDER — OXYCODONE HCL 5 MG/5ML PO SOLN: 5.0000 mg | Freq: Once | ORAL | Status: DC | PRN

## 2020-01-01 MED ORDER — LACTATED RINGERS IV SOLN: INTRAVENOUS | Status: DC

## 2020-01-01 MED ORDER — LIDOCAINE HCL (PF) 1 % IJ SOLN
INTRAMUSCULAR | Status: AC
  Filled 2020-01-01: qty 30

## 2020-01-01 MED ORDER — LIDOCAINE HCL (CARDIAC) PF 100 MG/5ML IV SOSY
PREFILLED_SYRINGE | INTRAVENOUS | Status: DC | PRN
  Administered 2020-01-01: 100 mg via INTRAVENOUS

## 2020-01-01 MED ORDER — MIDAZOLAM HCL 2 MG/2ML IJ SOLN
INTRAMUSCULAR | Status: AC
  Filled 2020-01-01: qty 2

## 2020-01-01 MED ORDER — MIDAZOLAM HCL 2 MG/2ML IJ SOLN
INTRAMUSCULAR | Status: DC | PRN
  Administered 2020-01-01: 2 mg via INTRAVENOUS

## 2020-01-01 MED ORDER — ONDANSETRON HCL 4 MG/2ML IJ SOLN
INTRAMUSCULAR | Status: DC | PRN
  Administered 2020-01-01: 4 mg via INTRAVENOUS

## 2020-01-01 MED ORDER — ORAL CARE MOUTH RINSE: 15.0000 mL | Freq: Once | OROMUCOSAL | Status: AC

## 2020-01-01 MED ORDER — EPHEDRINE SULFATE 50 MG/ML IJ SOLN
INTRAMUSCULAR | Status: DC | PRN
  Administered 2020-01-01 (×5): 10 mg via INTRAVENOUS

## 2020-01-01 MED ORDER — ONDANSETRON HCL 4 MG/2ML IJ SOLN: 4.0000 mg | Freq: Once | INTRAMUSCULAR | Status: DC | PRN

## 2020-01-01 MED ORDER — ACETAMINOPHEN 325 MG PO TABS: 650.0000 mg | ORAL_TABLET | ORAL | Status: DC | PRN

## 2020-01-01 SURGICAL SUPPLY — 56 items
ADH SKN CLS APL DERMABOND .7 (GAUZE/BANDAGES/DRESSINGS) ×2
APL PRP STRL LF DISP 70% ISPRP (MISCELLANEOUS) ×1
BAG DECANTER FOR FLEXI CONT (MISCELLANEOUS) ×2 IMPLANT
BINDER BREAST LRG (GAUZE/BANDAGES/DRESSINGS) IMPLANT
BINDER BREAST MEDIUM (GAUZE/BANDAGES/DRESSINGS) IMPLANT
BINDER BREAST XLRG (GAUZE/BANDAGES/DRESSINGS) ×1 IMPLANT
BINDER BREAST XXLRG (GAUZE/BANDAGES/DRESSINGS) IMPLANT
BIOPATCH RED 1 DISK 7.0 (GAUZE/BANDAGES/DRESSINGS) ×2 IMPLANT
BLADE HEX COATED 2.75 (ELECTRODE) ×2 IMPLANT
BLADE SURG 10 STRL SS (BLADE) ×2 IMPLANT
BLADE SURG 15 STRL LF DISP TIS (BLADE) ×2 IMPLANT
BLADE SURG 15 STRL SS (BLADE) ×4
BNDG GAUZE ELAST 4 BULKY (GAUZE/BANDAGES/DRESSINGS) ×4 IMPLANT
CANISTER SUCT 3000ML PPV (MISCELLANEOUS) ×1 IMPLANT
CHLORAPREP W/TINT 26 (MISCELLANEOUS) ×2 IMPLANT
COVER WAND RF STERILE (DRAPES) ×2 IMPLANT
DECANTER SPIKE VIAL GLASS SM (MISCELLANEOUS) IMPLANT
DERMABOND ADVANCED (GAUZE/BANDAGES/DRESSINGS) ×2
DERMABOND ADVANCED .7 DNX12 (GAUZE/BANDAGES/DRESSINGS) ×2 IMPLANT
DRAIN CHANNEL 19F RND (DRAIN) IMPLANT
DRAPE LAPAROSCOPIC ABDOMINAL (DRAPES) ×2 IMPLANT
DRSG OPSITE POSTOP 4X6 (GAUZE/BANDAGES/DRESSINGS) ×2 IMPLANT
DRSG PAD ABDOMINAL 8X10 ST (GAUZE/BANDAGES/DRESSINGS) ×4 IMPLANT
ELECT BLADE 4.0 EZ CLEAN MEGAD (MISCELLANEOUS) ×2
ELECT REM PT RETURN 9FT ADLT (ELECTROSURGICAL) ×2
ELECTRODE BLDE 4.0 EZ CLN MEGD (MISCELLANEOUS) ×1 IMPLANT
ELECTRODE REM PT RTRN 9FT ADLT (ELECTROSURGICAL) ×1 IMPLANT
EVACUATOR SILICONE 100CC (DRAIN) IMPLANT
GLOVE BIO SURGEON STRL SZ 6.5 (GLOVE) ×5 IMPLANT
GLOVE SURG UNDER POLY LF SZ6 (GLOVE) ×1 IMPLANT
GOWN STRL REUS W/ TWL LRG LVL3 (GOWN DISPOSABLE) ×2 IMPLANT
GOWN STRL REUS W/TWL LRG LVL3 (GOWN DISPOSABLE) ×8
NDL HYPO 25GX1X1/2 BEV (NEEDLE) IMPLANT
NDL SPNL 22GX3.5 QUINCKE BK (NEEDLE) ×1 IMPLANT
NEEDLE HYPO 25GX1X1/2 BEV (NEEDLE) ×2 IMPLANT
NEEDLE SPNL 22GX3.5 QUINCKE BK (NEEDLE) ×2 IMPLANT
NS IRRIG 1000ML POUR BTL (IV SOLUTION) ×2 IMPLANT
PACK GENERAL/GYN (CUSTOM PROCEDURE TRAY) ×2 IMPLANT
PENCIL SMOKE EVACUATOR (MISCELLANEOUS) ×1 IMPLANT
SPONGE LAP 18X18 RF (DISPOSABLE) ×4 IMPLANT
STRIP CLOSURE SKIN 1/2X4 (GAUZE/BANDAGES/DRESSINGS) ×3 IMPLANT
SUT MNCRL AB 3-0 PS2 18 (SUTURE) ×6 IMPLANT
SUT MNCRL AB 4-0 PS2 18 (SUTURE) ×8 IMPLANT
SUT MON AB 5-0 PS2 18 (SUTURE) ×4 IMPLANT
SUT PDS AB 2-0 CT2 27 (SUTURE) IMPLANT
SUT PDS AB 3-0 SH 27 (SUTURE) ×4 IMPLANT
SUT SILK 3 0 PS 1 (SUTURE) IMPLANT
SUT VIC AB 3-0 SH 27 (SUTURE) ×4
SUT VIC AB 3-0 SH 27X BRD (SUTURE) ×2 IMPLANT
SUT VICRYL 4-0 PS2 18IN ABS (SUTURE) ×4 IMPLANT
SYR 50ML LL SCALE MARK (SYRINGE) IMPLANT
SYR CONTROL 10ML LL (SYRINGE) ×1 IMPLANT
TOWEL GREEN STERILE FF (TOWEL DISPOSABLE) ×4 IMPLANT
TUBE CONNECTING 20X1/4 (TUBING) ×2 IMPLANT
UNDERPAD 30X36 HEAVY ABSORB (UNDERPADS AND DIAPERS) ×4 IMPLANT
YANKAUER SUCT BULB TIP NO VENT (SUCTIONS) ×2 IMPLANT

## 2020-01-01 NOTE — Anesthesia Procedure Notes (Signed)
Procedure Name: Intubation Date/Time: 01/01/2020 8:41 AM Performed by: Claris Che, CRNA Pre-anesthesia Checklist: Patient identified, Emergency Drugs available, Suction available, Patient being monitored and Timeout performed Patient Re-evaluated:Patient Re-evaluated prior to induction Oxygen Delivery Method: Circle system utilized Preoxygenation: Pre-oxygenation with 100% oxygen Induction Type: IV induction and Cricoid Pressure applied Ventilation: Mask ventilation without difficulty Laryngoscope Size: Mac and 3 Grade View: Grade II Tube type: Oral Tube size: 7.5 mm Number of attempts: 1 Airway Equipment and Method: Stylet Placement Confirmation: ETT inserted through vocal cords under direct vision,  positive ETCO2 and breath sounds checked- equal and bilateral Secured at: 22 cm Tube secured with: Tape Dental Injury: Teeth and Oropharynx as per pre-operative assessment

## 2020-01-01 NOTE — Anesthesia Postprocedure Evaluation (Signed)
Anesthesia Post Note  Patient: Sophia Marks  Procedure(s) Performed: MAMMARY REDUCTION  (BREAST) (Bilateral Breast)     Patient location during evaluation: PACU Anesthesia Type: General Level of consciousness: awake and alert Pain management: pain level controlled Vital Signs Assessment: post-procedure vital signs reviewed and stable Respiratory status: spontaneous breathing, nonlabored ventilation, respiratory function stable and patient connected to nasal cannula oxygen Cardiovascular status: blood pressure returned to baseline and stable Postop Assessment: no apparent nausea or vomiting Anesthetic complications: no   No complications documented.  Last Vitals:  Vitals:   01/01/20 1118 01/01/20 1133  BP: 119/70 (!) 121/47  Pulse: 74 95  Resp: 11 19  Temp:    SpO2: 97% 95%    Last Pain:  Vitals:   01/01/20 1118  TempSrc:   PainSc: 0-No pain                 [name] COKER

## 2020-01-01 NOTE — Discharge Instructions (Addendum)
INSTRUCTIONS FOR AFTER SURGERY   You will likely have some questions about what to expect following your operation.  The following information will help you and your family understand what to expect when you are discharged from the hospital.  Following these guidelines will help ensure a smooth recovery and reduce risks of complications.  Postoperative instructions include information on: diet, wound care, medications and physical activity.  AFTER SURGERY Expect to go home after the procedure.  In some cases, you may need to spend one night in the hospital for observation.  DIET This surgery does not require a specific diet.  However, I have to mention that the healthier you eat the better your body can start healing. It is important to increasing your protein intake.  This means limiting the foods with added sugar.  Focus on fruits and vegetables and some meat. It is very important to drink water after your surgery.  If your urine is bright yellow, then it is concentrated, and you need to drink more water.  As a general rule after surgery, you should have 8 ounces of water every hour while awake.  If you find you are persistently nauseated or unable to take in liquids let us know.  NO TOBACCO USE or EXPOSURE.  This will slow your healing process and increase the risk of a wound.  WOUND CARE If you don't have a drain: You can shower the day after surgery.  Use fragrance free soap.  Dial, Dove, Ivory and Cetaphil are usually mild on the skin.  If you have a drain: Clean with baby wipes until the drain is removed.   If you have steri-strips / tape directly attached to your skin leave them in place. It is OK to get these wet.  No baths, pools or hot tubs for two weeks. We close your incision to leave the smallest and best-looking scar. No ointment or creams on your incisions until given the go ahead.  Especially not Neosporin (Too many skin reactions with this one).  A few weeks after surgery you can use  Mederma and start massaging the scar. We ask you to wear your binder or sports bra for the first 6 weeks around the clock, including while sleeping. This provides added comfort and helps reduce the fluid accumulation at the surgery site.  ACTIVITY No heavy lifting until cleared by the doctor.  It is OK to walk and climb stairs. In fact, moving your legs is very important to decrease your risk of a blood clot.  It will also help keep you from getting deconditioned.  Every 1 to 2 hours get up and walk for 5 minutes. This will help with a quicker recovery back to normal.  Let pain be your guide so you don't do too much.  NO, you cannot do the spring cleaning and don't plan on taking care of anyone else.  This is your time for TLC.   WORK Everyone returns to work at different times. As a rough guide, most people take at least 1 - 2 weeks off prior to returning to work. If you need documentation for your job, bring the forms to your postoperative follow up visit.  DRIVING Arrange for someone to bring you home from the hospital.  You may be able to drive a few days after surgery but not while taking any narcotics or valium.  BOWEL MOVEMENTS Constipation can occur after anesthesia and while taking pain medication.  It is important to stay ahead for   your comfort.  We recommend taking Milk of Magnesia (2 tablespoons; twice a day) while taking the pain pills.  SEROMA This is fluid your body tried to put in the surgical site.  This is normal but if it creates excessive pain and swelling let us know.  It usually decreases in a few weeks.  MEDICATIONS and PAIN CONTROL At your preoperative visit for you history and physical you were given the following medications: 1. An antibiotic: Start this medication when you get home and take according to the instructions on the bottle. 2. Zofran 4 mg:  This is to treat nausea and vomiting.  You can take this every 6 hours as needed and only if needed. 3. Norco  (hydrocodone/acetaminophen) 5/325 mg:  This is only to be used after you have taken the motrin or the tylenol. Every 8 hours as needed. Over the counter Medication to take: 4. Ibuprofen (Motrin) 600 mg:  Take this every 6 hours.  If you have additional pain then take 500 mg of the tylenol.  Only take the Norco after you have tried these two. 5. Miralax or stool softener of choice: Take this according to the bottle if you take the Norco.  WHEN TO CALL Call your surgeon's office if any of the following occur: . Fever 101 degrees F or greater . Excessive bleeding or fluid from the incision site. . Pain that increases over time without aid from the medications . Redness, warmth, or pus draining from incision sites . Persistent nausea or inability to take in liquids . Severe misshapen area that underwent the operation.

## 2020-01-01 NOTE — Interval H&P Note (Signed)
History and Physical Interval Note:  01/01/2020 8:08 AM  Sophia Marks  has presented today for surgery, with the diagnosis of mammary hypertrophy.  The various methods of treatment have been discussed with the patient and family. After consideration of risks, benefits and other options for treatment, the patient has consented to  Procedure(s) with comments: MAMMARY REDUCTION  (BREAST) (Bilateral) - 3 hours as a surgical intervention.  The patient's history has been reviewed, patient examined, no change in status, stable for surgery.  I have reviewed the patient's chart and labs.  Questions were answered to the patient's satisfaction.     Alena Bills Benney [place]

## 2020-01-01 NOTE — Anesthesia Preprocedure Evaluation (Addendum)

## 2020-01-01 NOTE — Op Note (Signed)
Breast Reduction Op note:    DATE OF PROCEDURE: 01/01/2020  LOCATION: [facility]  SURGEON: Alan Ripper Sanger Malan Werk, DO  ASSISTANT: Keenan Bachelor, PA  PREOPERATIVE DIAGNOSIS 1. Macromastia 2. Neck Pain 3. Back Pain  POSTOPERATIVE DIAGNOSIS 1. Macromastia 2. Neck Pain 3. Back Pain  PROCEDURES 1. Bilateral breast reduction.  Right reduction 365g, Left reduction 420g  COMPLICATIONS: None.  DRAINS: none  INDICATIONS FOR PROCEDURE Sophia Marks is a 39 y.o. year-old female born on 1980/12/04,with a history of symptomatic macromastia with concominant back pain, neck pain, shoulder grooving from her bra.   MRN: [id number]  CONSENT Informed consent was obtained directly from the patient. The risks, benefits and alternatives were fully discussed. Specific risks including but not limited to bleeding, infection, hematoma, seroma, scarring, pain, nipple necrosis, asymmetry, poor cosmetic results, and need for further surgery were discussed. The patient had ample opportunity to have her questions answered to her satisfaction.  DESCRIPTION OF PROCEDURE  Patient was brought into the operating room and placed in a supine position.  SCDs were placed and appropriate padding was performed.  Antibiotics were given. The patient underwent general anesthesia and the chest was prepped and draped in a sterile fashion.  A timeout was performed and all information was confirmed to be correct.  Right side: Preoperative markings were confirmed.  Incision lines were injected with local with epinephrine.  After waiting for vasoconstriction, the marked lines were incised.  A Wise-pattern superomedial breast reduction was performed by de-epithelializing the pedicle, using bovie to create the superomedial pedicle, and removing breast tissue from the superior, lateral, and inferior portions of the breast.  Care was taken to not undermine the breast pedicle. Hemostasis was achieved.   The nipple was gently rotated into position and the soft tissue closed with 4-0 Monocryl.   The pocket was irrigated and hemostasis confirmed.  The deep tissues were approximated with 3-0 PDS and Monocryl sutures and the skin was closed with deep dermal and subcuticular 4-0 Monocryl sutures.  The nipple and skin flaps had good capillary refill at the end of the procedure.    Left side: Preoperative markings were confirmed.  Incision lines were injected with local with epinephrine.  After waiting for vasoconstriction, the marked lines were incised.  A Wise-pattern superomedial breast reduction was performed by de-epithelializing the pedicle, using bovie to create the superomedial pedicle, and removing breast tissue from the superior, lateral, and inferior portions of the breast.  Care was taken to not undermine the breast pedicle. Hemostasis was achieved.  The nipple was gently rotated into position and the soft tissue was closed with 4-0 Monocryl.  The patient was sat upright and size and shape symmetry was confirmed.  The pocket was irrigated and hemostasis confirmed.  The deep tissues were approximated with 3-0 PDS and Monocryl sutures and the skin was closed with deep dermal and subcuticular 4-0 Monocryl sutures.  Dermabond was applied.  A breast binder and ABDs were placed.  The nipple and skin flaps had good capillary refill at the end of the procedure.  The patient tolerated the procedure well. The patient was allowed to wake from anesthesia and taken to the recovery room in satisfactory condition.  The advanced practice practitioner (APP) assisted throughout the case.  The APP was essential in retraction and counter traction when needed to make the case progress smoothly.  This retraction and assistance made it possible to see the tissue plans for the procedure.  The assistance was needed  for blood control, tissue re-approximation and assisted with closure of the incision site.

## 2020-01-01 NOTE — Transfer of Care (Signed)
Immediate Anesthesia Transfer of Care Note  Patient: Sophia Marks  Procedure(s) Performed: MAMMARY REDUCTION  (BREAST) (Bilateral Breast)  Patient Location: PACU  Anesthesia Type:General  Level of Consciousness: awake, alert , oriented, drowsy and patient cooperative  Airway & Oxygen Therapy: Patient Spontanous Breathing and Patient connected to nasal cannula oxygen  Post-op Assessment: Report given to RN, Post -op Vital signs reviewed and stable and Patient moving all extremities X 4  Post vital signs: Reviewed and stable  Last Vitals:  Vitals Value Taken Time  BP 123/58 01/01/20 1048  Temp    Pulse 79 01/01/20 1051  Resp 8 01/01/20 1051  SpO2 93 % 01/01/20 1051  Vitals shown include unvalidated device data.  Last Pain:  Vitals:   01/01/20 0657  TempSrc:   PainSc: 0-No pain      Patients Stated Pain Goal: 4 (01/01/20 0657)  Complications: No complications documented.

## 2020-01-02 ENCOUNTER — Encounter (HOSPITAL_COMMUNITY): Payer: Self-pay | Admitting: Plastic Surgery

## 2020-01-03 LAB — SURGICAL PATHOLOGY

## 2020-01-03 NOTE — Progress Notes (Signed)
2nd attempt at calling pt in regards to her results. Will send a my chart message.

## 2020-01-10 ENCOUNTER — Ambulatory Visit (INDEPENDENT_AMBULATORY_CARE_PROVIDER_SITE_OTHER): Payer: Medicaid Other | Admitting: Plastic Surgery

## 2020-01-10 ENCOUNTER — Other Ambulatory Visit: Payer: Self-pay

## 2020-01-10 ENCOUNTER — Encounter: Payer: Self-pay | Admitting: Plastic Surgery

## 2020-01-10 VITALS — BP 143/70 | HR 86 | Temp 97.7°F

## 2020-01-10 DIAGNOSIS — N62 Hypertrophy of breast: Secondary | ICD-10-CM

## 2020-01-10 NOTE — Progress Notes (Signed)
The patient is a 39 year old female here with her mom for follow-up on her bilateral breast reduction.  She had about 400 g removed from each breast.  The patient thinks she might be a little bigger than she wanted.  She does have a little bit of seroma but I would like to see if her body will absorb it on its own.  She also has some swelling and bruising as expected.  All the incisions are intact and her dressings are in place.  The honeycomb dressings are getting a little bit loose.  If they come off between now and her next visit that is okay.  She can go into a sports bra and shower.  And I look forward to seeing her back in 1 week.  I expect that she will get a little bit smaller in the next several months when the swelling subsides.

## 2020-01-15 ENCOUNTER — Telehealth: Payer: Self-pay

## 2020-01-15 ENCOUNTER — Other Ambulatory Visit: Payer: Medicaid Other

## 2020-01-15 ENCOUNTER — Other Ambulatory Visit: Payer: Self-pay

## 2020-01-15 ENCOUNTER — Encounter: Payer: Self-pay | Admitting: Plastic Surgery

## 2020-01-15 ENCOUNTER — Ambulatory Visit (INDEPENDENT_AMBULATORY_CARE_PROVIDER_SITE_OTHER): Payer: Medicaid Other | Admitting: Plastic Surgery

## 2020-01-15 VITALS — BP 128/60 | HR 100 | Temp 98.3°F

## 2020-01-15 DIAGNOSIS — Z9889 Other specified postprocedural states: Secondary | ICD-10-CM | POA: Insufficient documentation

## 2020-01-15 NOTE — Telephone Encounter (Signed)
Returned patients call. LMVM for her to call back.  Per Loistine Simas, she can not have a massage at this time and will need to schedule her appointment at least 2 months from her surgery. Surgery was on 01/01/20.

## 2020-01-15 NOTE — Progress Notes (Signed)
Patient is a 39 year old female here for follow-up after undergoing bilateral breast reduction on 01/01/2020 with Dr. Ulice Bold.  (Right: 365 g; left: 420 g)  ~ 2 weeks PO Patient is accompanied today by her mother.  She reports she is feeling well overall.  Denies fever/chills, nausea/vomiting.  Reports some swelling and discomfort of the left breast.  Also reports some mild itching.  Both mother and daughter express concerns that they felt more should have been taken off to make her smaller.  Honeycomb dressings and Steri-Strips were removed today.  Bilateral incisions are healing very nicely, C/D/I.  No signs of infection, drainage, redness.  There is some mild bilateral diffuse swelling present.  The left breast is larger and more firm to the touch, suspect seroma.  40 cc of serous sanguinous fluid was aspirated from the left breast today using sterile technique.  Patient reports she feels some immediate relief.  Recommend giving it a little bit more time for the diffuse swelling to go down.  Continue to wear compression garment 24/7.  Continue to avoid heavy lifting.  May shower normally, avoid scrubbing the breasts.  Follow-up in 3 weeks.  Return precautions provided.  Call office with any questions/concerns.

## 2020-01-15 NOTE — Telephone Encounter (Signed)
Patient called back saying she had missed Schuyler Amor' call.  I let her know that she needs to wait at least two months after her surgery to have a massage per French Ana.

## 2020-01-15 NOTE — Telephone Encounter (Signed)
Patient has a massage scheduled for 02/04/2020 and she would like to know if it's ok for her to keep her appointment.  She would be laying on her chest for part of the massage.  Please call.

## 2020-01-17 ENCOUNTER — Encounter: Payer: Medicaid Other | Admitting: Surgical

## 2020-02-07 ENCOUNTER — Ambulatory Visit: Payer: Medicaid Other | Admitting: Plastic Surgery

## 2020-02-12 ENCOUNTER — Encounter: Payer: Self-pay | Admitting: Surgical

## 2020-02-12 ENCOUNTER — Other Ambulatory Visit: Payer: Self-pay

## 2020-02-12 ENCOUNTER — Ambulatory Visit (INDEPENDENT_AMBULATORY_CARE_PROVIDER_SITE_OTHER): Payer: Medicaid Other | Admitting: Surgical

## 2020-02-12 VITALS — BP 150/85 | HR 94 | Temp 97.3°F

## 2020-02-12 DIAGNOSIS — Z9889 Other specified postprocedural states: Secondary | ICD-10-CM

## 2020-02-12 NOTE — Progress Notes (Signed)
Patient is a 40 year old female here for follow-up after bilateral breast reduction with Dr. Ulice Bold on 01/01/2020.  Patient is here with her mother.  She reports that she has a little bit of tenderness of her midline chest/sternum.  She reports that is tender to palpation.  She reports that she has a history of reflux, but she is unsure if this feels similar or not.  She reports that she has not been taking any reflux medications.  She does report that she feels as if she would have liked a little bit more tissue to be removed from her bilateral breast and surgery, however she does report relief from the surgery and reports that walking is much more comfortable for her now.  Chaperone present on exam On exam bilateral NAC's are viable, bilateral breast incisions are CDI.  No erythema noted.  No wounds noted.  No drainage noted.  Bilateral breasts are soft, no fluid wave noted with palpation.  No areas of fat necrosis noted.  We discussed patient has no restrictions at this time. We discussed use of scar creams. We discussed that there is some swelling still present, this will continue to resolve and be stable by approximately 3 months postop.  We did discuss that the size of her breast will likely not change much after the swelling has resolved.  We discussed scheduling an additional follow-up or following up as needed.  Patient and mother are comfortable following up on an as-needed basis and are very happy with the outcome of her surgery despite feeling as if they would have liked a little bit more tissue removed.  We did review her preoperative photos today and they were shocked at the difference in the breast size.  Photos were taken of the patient today and placed in the patient's chart with the patient's permission.

## 2020-02-18 ENCOUNTER — Other Ambulatory Visit: Payer: Self-pay

## 2020-02-18 ENCOUNTER — Other Ambulatory Visit: Payer: Self-pay | Admitting: Podiatry

## 2020-02-18 ENCOUNTER — Ambulatory Visit (INDEPENDENT_AMBULATORY_CARE_PROVIDER_SITE_OTHER): Payer: Medicaid Other | Admitting: Podiatry

## 2020-02-18 DIAGNOSIS — B351 Tinea unguium: Secondary | ICD-10-CM | POA: Diagnosis not present

## 2020-02-18 DIAGNOSIS — Z79899 Other long term (current) drug therapy: Secondary | ICD-10-CM | POA: Diagnosis not present

## 2020-02-18 DIAGNOSIS — L84 Corns and callosities: Secondary | ICD-10-CM | POA: Diagnosis not present

## 2020-02-18 DIAGNOSIS — L600 Ingrowing nail: Secondary | ICD-10-CM | POA: Diagnosis not present

## 2020-02-18 NOTE — Patient Instructions (Signed)
Terbinafine oral granules What is this medicine? TERBINAFINE (TER bin a feen) is an antifungal medicine. It is used to treat certain kinds of fungal or yeast infections. This medicine may be used for other purposes; ask your health care provider or pharmacist if you have questions. COMMON BRAND NAME(S): Lamisil What should I tell my health care provider before I take this medicine? They need to know if you have any of these conditions:  drink alcoholic beverages  kidney disease  liver disease  an unusual or allergic reaction to Terbinafine, other medicines, foods, dyes, or preservatives  pregnant or trying to get pregnant  breast-feeding How should I use this medicine? Take this medicine by mouth. Follow the directions on the prescription label. Hold packet with cut line on top. Shake packet gently to settle contents. Tear packet open along cut line, or use scissors to cut across line. Carefully pour the entire contents of packet onto a spoonful of a soft food, such as pudding or other soft, non-acidic food such as mashed potatoes (do NOT use applesauce or a fruit-based food). If two packets are required for each dose, you may either sprinkle the content of both packets on one spoonful of non-acidic food, or sprinkle the contents of both packets on two spoonfuls of non-acidic food. Make sure that no granules remain in the packet. Swallow the mxiture of the food and granules without chewing. Take your medicine at regular intervals. Do not take it more often than directed. Take all of your medicine as directed even if you think you are better. Do not skip doses or stop your medicine early. Contact your pediatrician or health care professional regarding the use of this medicine in children. While this medicine may be prescribed for children as young as 4 years for selected conditions, precautions do apply. Overdosage: If you think you have taken too much of this medicine contact a poison control  center or emergency room at once. NOTE: This medicine is only for you. Do not share this medicine with others. What if I miss a dose? If you miss a dose, take it as soon as you can. If it is almost time for your next dose, take only that dose. Do not take double or extra doses. What may interact with this medicine? Do not take this medicine with any of the following medications:  thioridazine This medicine may also interact with the following medications:  beta-blockers  caffeine  cimetidine  cyclosporine  MAOIs like Carbex, Eldepryl, Marplan, Nardil, and Parnate  medicines for fungal infections like fluconazole and ketoconazole  medicines for irregular heartbeat like amiodarone, flecainide and propafenone  rifampin  SSRIs like citalopram, escitalopram, fluoxetine, fluvoxamine, paroxetine and sertraline  tricyclic antidepressants like amitriptyline, clomipramine, desipramine, imipramine, nortriptyline, and others  warfarin This list may not describe all possible interactions. Give your health care provider a list of all the medicines, herbs, non-prescription drugs, or dietary supplements you use. Also tell them if you smoke, drink alcohol, or use illegal drugs. Some items may interact with your medicine. What should I watch for while using this medicine? Your doctor may monitor your liver function. Tell your doctor right away if you have nausea or vomiting, loss of appetite, stomach pain on your right upper side, yellow skin, dark urine, light stools, or are over tired. This medicine may cause serious skin reactions. They can happen weeks to months after starting the medicine. Contact your health care provider right away if you notice fevers or flu-like symptoms   with a rash. The rash may be red or purple and then turn into blisters or peeling of the skin. Or, you might notice a red rash with swelling of the face, lips or lymph nodes in your neck or under your arms. You need to take  this medicine for 6 weeks or longer to cure the fungal infection. Take your medicine regularly for as long as your doctor or health care provider tells you to. What side effects may I notice from receiving this medicine? Side effects that you should report to your doctor or health care professional as soon as possible:  allergic reactions like skin rash or hives, swelling of the face, lips, or tongue  change in vision  dark urine  fever or infection  general ill feeling or flu-like symptoms  light-colored stools  loss of appetite, nausea  rash, fever, and swollen lymph nodes  redness, blistering, peeling or loosening of the skin, including inside the mouth  right upper belly pain  unusually weak or tired  yellowing of the eyes or skin Side effects that usually do not require medical attention (report to your doctor or health care professional if they continue or are bothersome):  changes in taste  diarrhea  hair loss  muscle or joint pain  stomach upset This list may not describe all possible side effects. Call your doctor for medical advice about side effects. You may report side effects to FDA at 1-800-FDA-1088. Where should I keep my medicine? Keep out of the reach of children. Store at room temperature between 15 and 30 degrees C (59 and 86 degrees F). Throw away any unused medicine after the expiration date. NOTE: This sheet is a summary. It may not cover all possible information. If you have questions about this medicine, talk to your doctor, pharmacist, or health care provider.  2021 Elsevier/Gold Standard (2018-04-27 15:35:11)  

## 2020-02-19 LAB — CBC WITH DIFFERENTIAL/PLATELET
Basophils Absolute: 0 10*3/uL (ref 0.0–0.2)
Basos: 0 %
EOS (ABSOLUTE): 0.2 10*3/uL (ref 0.0–0.4)
Eos: 2 %
Hematocrit: 41.4 % (ref 34.0–46.6)
Hemoglobin: 14.3 g/dL (ref 11.1–15.9)
Immature Grans (Abs): 0 10*3/uL (ref 0.0–0.1)
Immature Granulocytes: 0 %
Lymphocytes Absolute: 2.9 10*3/uL (ref 0.7–3.1)
Lymphs: 30 %
MCH: 30.9 pg (ref 26.6–33.0)
MCHC: 34.5 g/dL (ref 31.5–35.7)
MCV: 89 fL (ref 79–97)
Monocytes Absolute: 0.6 10*3/uL (ref 0.1–0.9)
Monocytes: 6 %
Neutrophils Absolute: 6 10*3/uL (ref 1.4–7.0)
Neutrophils: 62 %
Platelets: 336 10*3/uL (ref 150–450)
RBC: 4.63 x10E6/uL (ref 3.77–5.28)
RDW: 11.8 % (ref 11.7–15.4)
WBC: 9.7 10*3/uL (ref 3.4–10.8)

## 2020-02-19 LAB — HEPATIC FUNCTION PANEL
ALT: 17 IU/L (ref 0–32)
AST: 15 IU/L (ref 0–40)
Albumin: 4.6 g/dL (ref 3.8–4.8)
Alkaline Phosphatase: 134 IU/L — ABNORMAL HIGH (ref 44–121)
Bilirubin Total: 0.3 mg/dL (ref 0.0–1.2)
Bilirubin, Direct: <0.1 mg/dL (ref 0.00–0.40)
Total Protein: 7.5 g/dL (ref 6.0–8.5)

## 2020-02-19 NOTE — Progress Notes (Signed)
Subjective:   Patient ID: Sophia Marks, female   DOB: 40 y.o.   MRN: [id number]   HPI 40 year old female presents the office with her mom for concerns of toenail fungus particular her right big toenail which was becoming thickened discolored.  She also gets a "pressure point" on the bottom of her right foot pointing to submetatarsal 2.  This is been a chronic issue.  She denies any open lesions.  She has had no recent treatment.  No other concerns today.   Review of Systems  All other systems reviewed and are negative.  Past Medical History:  Diagnosis Date  . Acquired hypothyroidism 12/22/2015  . Back pain 04/05/2019  . Cerebral palsy   . Female stress incontinence   . Generalized anxiety disorder   . Hydrocephalus    Congenital  . Insomnia   . Laryngopharyngeal reflux (LPR) 03/08/2016  . Major depressive disorder   . 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 Neck pain 04/05/2019  . 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 Symptomatic mammary hypertrophy 04/05/2019  . VP (ventriculoperitoneal) shunt status     Past Surgical History:  Procedure Laterality Date  . BRAIN SURGERY    . BREAST REDUCTION SURGERY Bilateral 01/01/2020   Procedure: MAMMARY REDUCTION  (BREAST);  Surgeon: Peggye Form, DO;  Location: [facility] OR;  Service: Plastics;  Laterality: Bilateral;  3 hours  . heal surgery    . VENTRICULOPERITONEAL SHUNT  11/16/2011   Procedure: SHUNT INSERTION VENTRICULAR-PERITONEAL;  Surgeon: Maeola Harman, MD;  Location: [facility];  Service: Neurosurgery;  Laterality: Right;  Ventricular Peritoneal Shunt Insertion/Revision with Laparoscopic Abdominal Approach     Current Outpatient Medications:  .  acetaminophen (TYLENOL) 500 MG tablet, Take 1,000 mg by mouth every 6 (six) hours as needed for moderate pain or headache., Disp: , Rfl:  .  citalopram (CELEXA) 20 MG tablet, Take 10 mg by mouth daily. , Disp: , Rfl:  .   hydrOXYzine (ATARAX/VISTARIL) 25 MG tablet, Take 25 mg by mouth every 8 (eight) hours as needed for anxiety. , Disp: , Rfl:  .  ibuprofen (ADVIL) 200 MG tablet, Take 400 mg by mouth every 6 (six) hours as needed for headache or moderate pain., Disp: , Rfl:  .  levonorgestrel (MIRENA, 52 MG,) 20 MCG/24HR IUD, See admin instructions., Disp: , Rfl:  .  levothyroxine (SYNTHROID, LEVOTHROID) 50 MCG tablet, Take 50 mcg by mouth daily before breakfast. , Disp: , Rfl:  .  valACYclovir (VALTREX) 1000 MG tablet, Take 1,000 mg by mouth 2 (two) times daily as needed (cold sores)., Disp: , Rfl:   No Known Allergies      Objective:  Physical Exam  General: AAO x3, NAD  Dermatological: Nails are hypertrophic, dystrophic with yellow-brown discoloration was noted to the right hallux toenail significantly dystrophic.  Hyperkeratotic lesion left medial first MPJ, right submetatarsal 2.  There is no underlying ulcerations drainage or any signs of infection.  No open lesions today.  Vascular: Dorsalis Pedis artery and Posterior Tibial artery pedal pulses are 2/4 bilateral with immedate capillary fill time.  There is no pain with calf compression, swelling, warmth, erythema.   Neruologic: Grossly intact via light touch bilateral.   Musculoskeletal: Bunion deformities present bilaterally.  Muscular strength 5/5 in all groups tested bilateral.  Gait: Unassisted, Nonantalgic.       Assessment:   40 year old female with onychomycosis, hyperkeratotic lesions     Plan:  -Treatment options  discussed including all alternatives, risks, and complications -Etiology of symptoms were discussed -Regards to toenail fungus, right hallux onychodystrophy discussed nail removal to right hallux toenail.  Discussion elected to proceed with oral treatment for nail fungus.  We will check a CBC and LFT.  Once I get this result back we will likely order Lamisil.  Discussed with her I will see her in 6 weeks.  At that point can  reevaluate right hallux toenail removal if she wishes. -Debride the hyperkeratotic lesions without any complications or bleeding.  Recommend moisturizer daily and offloading pads were dispensed.  We did briefly discuss surgical intervention as well if needed in the future.  I do think a lot of the calluses and digital deformity is a combination of genetics as well as her gait abnormality with CP.  Vivi Barrack DPM

## 2020-02-24 ENCOUNTER — Telehealth: Payer: Self-pay

## 2020-02-24 NOTE — Telephone Encounter (Signed)
Patient called to let us know that Dr. Ulice Bold said that she could give her an antibiotic for the rash that's still on her hand from the IV.  Please call.  **[facility] at [address]

## 2020-02-25 MED ORDER — CLOBETASOL PROPIONATE 0.05 % EX LOTN: 1.0000 "application " | TOPICAL_LOTION | Freq: Two times a day (BID) | CUTANEOUS | 0 refills | Status: AC

## 2020-02-25 MED ORDER — CEPHALEXIN 500 MG PO CAPS: 500.0000 mg | ORAL_CAPSULE | Freq: Four times a day (QID) | ORAL | 0 refills | Status: AC

## 2020-02-25 NOTE — Telephone Encounter (Signed)
Skin irritation at IV site.

## 2020-02-25 NOTE — Telephone Encounter (Signed)
Called patient, LMVM. Keflex was sent to her pharmacy: [id number] - [place], [place] - [address] [place]

## 2020-03-02 ENCOUNTER — Telehealth: Payer: Self-pay | Admitting: *Deleted

## 2020-03-02 ENCOUNTER — Telehealth: Payer: Self-pay

## 2020-03-02 NOTE — Telephone Encounter (Signed)
Pt called today and has some questions about her results. Please advise

## 2020-03-02 NOTE — Telephone Encounter (Signed)
I attempted to call patient, no answer. Left VM to call back.

## 2020-03-02 NOTE — Telephone Encounter (Signed)
Called and [facility] @ 9:13am) informing the patient that I spoke with Dr. Ulice Bold and she stated not to do the PA for the cream.  The pills will be enough.    Informed the patient she can give me a call back if she has any questions.//AB/CMA

## 2020-03-02 NOTE — Telephone Encounter (Signed)
Called and spoke with the patient on (02/26/20) to let her know we received request for Prior Authorization for the prescription for the cream that was sent to the pharmacy at the same time the Rx for the antibiotic was sent.   Patient state that she didn't realize 2 prescriptions were sent in until she picked them up.    Informed the patient that I will wait and speak with Dr. Ulice Bold and see she if she wants to do something else or do the PA.  Also informed her that sometimes it's hard getting the PA's approved.  Patient stated that she will go ahead and start on the pills.//AB/CMA

## 2020-03-02 NOTE — Telephone Encounter (Signed)
Called and LMOM on (02/28/20 @ 12:09pm) asking the patient to return regarding PA.//AB/CMA

## 2020-03-03 ENCOUNTER — Telehealth: Payer: Self-pay | Admitting: Podiatry

## 2020-03-03 NOTE — Telephone Encounter (Signed)
I called and spoke with the patient. Due to LFT will hold off on oral Lamisil. She scheduled an appointment with her PCP before we start Lamisil or any oral medication  Marylene Land- can you fax the results of her blood work to her PCP, Jarrett Soho at [facility].   Thanks!

## 2020-03-03 NOTE — Telephone Encounter (Signed)
Patient called in requesting medical release form to be emailed or faxed to [facility] Fax# [phone]. She wants to share lab results with another provider regarding care. Call was tranferred over to Medical Records

## 2020-03-04 ENCOUNTER — Telehealth: Payer: Self-pay | Admitting: Podiatry

## 2020-03-04 NOTE — Telephone Encounter (Signed)
Patient has requested return call regarding treatment, stated you mentioned she could use something that could be painted on, Please Advise

## 2020-03-05 NOTE — Telephone Encounter (Signed)
Renea Ee called the patient to let her know that we can do topical for now. Ordered onychomycosis treatment through [facility].

## 2020-03-12 ENCOUNTER — Other Ambulatory Visit: Payer: Self-pay | Admitting: Podiatry

## 2020-03-12 MED ORDER — CICLOPIROX 8 % EX SOLN: Freq: Every day | CUTANEOUS | 2 refills | Status: DC

## 2020-03-12 NOTE — Telephone Encounter (Signed)
Patient calling to request call back today  regarding topical treatment for nail. Patient stated she thought topical treatment would be "mailed." Please send topical to preferred pharmacy.

## 2020-03-12 NOTE — Telephone Encounter (Signed)
I called and spoke with patient at length and explained that the penlac is a replacement for the compounded medication from [facility] that was not covered by her insurance.

## 2020-03-12 NOTE — Telephone Encounter (Signed)
Herbert Seta- can you please let her know that we will do the topical Penlac for now to her nails. I have sent it to the pharmacy for her.

## 2020-03-31 ENCOUNTER — Ambulatory Visit: Payer: Medicaid Other | Admitting: Podiatry

## 2020-04-20 ENCOUNTER — Ambulatory Visit (INDEPENDENT_AMBULATORY_CARE_PROVIDER_SITE_OTHER): Payer: Medicaid Other | Admitting: Podiatry

## 2020-04-20 ENCOUNTER — Other Ambulatory Visit: Payer: Self-pay

## 2020-04-20 DIAGNOSIS — B351 Tinea unguium: Secondary | ICD-10-CM | POA: Diagnosis not present

## 2020-04-20 DIAGNOSIS — L600 Ingrowing nail: Secondary | ICD-10-CM | POA: Diagnosis not present

## 2020-04-20 MED ORDER — CEPHALEXIN 500 MG PO CAPS: 500.0000 mg | ORAL_CAPSULE | Freq: Three times a day (TID) | ORAL | 0 refills | Status: DC

## 2020-04-20 NOTE — Patient Instructions (Signed)

## 2020-04-23 NOTE — Progress Notes (Signed)
Subjective: 40 year old female presents the office today for concerns of right big toenail still thickened discolored and not much change.  She is needed topical medication.  However since I last saw her she had a right big toenail which caused some bleeding.  This point she was at the right toenail removed. Denies any systemic complaints such as fevers, chills, nausea, vomiting. No acute changes since last appointment, and no other complaints at this time.   Objective: AAO x3, NAD DP/PT pulses palpable bilaterally, CRT less than 3 seconds Right hallux nail significantly hypertrophic, dystrophic with yellow-brown discoloration.  Incurvation present on the nail borders.  No significant change.  Some dried blood present underneath the nail.  There is no drainage or possibly obvious signs of infection. No pain with calf compression, swelling, warmth, erythema  Assessment: Right hallux onychodystrophy, onychomycosis  Plan: -All treatment options discussed with the patient including all alternatives, risks, complications.  -At this time, recommended total nail removal without chemical matricectomy to the right hallux. Risks and complications were discussed with the patient for which they understand and  verbally consent to the procedure. Under sterile conditions a total of 3 mL of a mixture of 2% lidocaine plain and 0.5% Marcaine plain was infiltrated in a hallux block fashion. Once anesthetized, the skin was prepped in sterile fashion. A tourniquet was then applied. Next the right hallux nail was sharply excised making sure to remove the entire offending nail border. Once the nail was  Removed, the area was debrided and the underlying skin was intact. The area was irrigated and hemostasis was obtained.  A dry sterile dressing was applied. After application of the dressing the tourniquet was removed and there is found to be an immediate capillary refill time to the digit. The patient tolerated the procedure  well any complications. Post procedure instructions were discussed the patient for which he verbally understood. Follow-up in one week for nail check or sooner if any problems are to arise. Discussed signs/symptoms of worsening infection and directed to call the office immediately should any occur or go directly to the emergency room. In the meantime, encouraged to call the office with any questions, concerns, changes symptoms. -Keflex  Vivi Barrack DPM   -Patient encouraged to call the office with any questions, concerns, change in symptoms.

## 2020-04-30 ENCOUNTER — Ambulatory Visit: Payer: Medicaid Other | Admitting: Podiatry

## 2020-04-30 ENCOUNTER — Other Ambulatory Visit: Payer: Self-pay

## 2020-04-30 DIAGNOSIS — M21619 Bunion of unspecified foot: Secondary | ICD-10-CM

## 2020-04-30 DIAGNOSIS — B351 Tinea unguium: Secondary | ICD-10-CM

## 2020-04-30 NOTE — Progress Notes (Signed)
Subjective: 40 year old female presents the office today for evaluation after having her right big toenail removed.  She said the area is healing well not having any drainage or pus or any swelling or redness.  Also other nails are thickened discolored with yellow discoloration.  She also has bunions.  They are asking about different treatment options for this. Denies any systemic complaints such as fevers, chills, nausea, vomiting. No acute changes since last appointment, and no other complaints at this time.   Objective: AAO x3, NAD DP/PT pulses palpable bilaterally, CRT less than 3 seconds Status post right hallux nail avulsion which is healing well.,  Granulation tissue still evident but there is no edema, erythema, drainage or pus or any obvious signs of infection.   Remainder the nails are hypertrophic, dystrophic with yellow discoloration.  There is no edema, erythema or signs of infection noted.   Significant bunion is present bilaterally.  Mild tenderness on the medial first metatarsal head.  No crepitation or restriction of first MPJ range of motion.   No pain with calf compression, swelling, warmth, erythema  Assessment: Status post right hallux toenail avulsion, onychomycosis, bunions  Plan: -All treatment options discussed with the patient including all alternatives, risks, complications.  -From a procedure standpoint she is doing well.  Continue soaking Epson salts cover with antibiotic ointment and bandage.  Monitor for any signs or symptoms of infection. -As a courtesy debrided the other nails with any complications or bleeding.  Continue topical antifungal. -We discussed with conservative as well as surgical options for the bunions.  She is going continue with conservative treatment after discussion.  Discussed toe separators, bunion pads as well as supportive shoes to help accommodate them. -Patient encouraged to call the office with any questions, concerns, change in symptoms.    Vivi Barrack DPM

## 2020-05-27 ENCOUNTER — Ambulatory Visit: Payer: Medicaid Other | Admitting: Neurology

## 2020-08-24 ENCOUNTER — Ambulatory Visit: Payer: Medicaid Other | Admitting: Neurology

## 2020-10-25 ENCOUNTER — Other Ambulatory Visit: Payer: Self-pay | Admitting: Podiatry

## 2020-11-30 ENCOUNTER — Ambulatory Visit: Payer: Medicaid Other | Admitting: Neurology

## 2020-12-01 NOTE — Progress Notes (Signed)
NEUROLOGY FOLLOW UP OFFICE NOTE  Sophia Marks [id number]  Assessment/Plan:   Paresthesias - peripheral etiology unlikely, given normal NCV and distribution of symptoms.  Involvement of face and forehead suggests intracranial abnormality if this is indeed neurologic.  MRI of brain with and without contrast.  Will need to coordinate with neurosurgery as she will need her shunt reprogrammed afterwards.  Further recommendations pending results.  Subjective:  Sophia Marks is a 40 year old right-handed woman with cerebral palsy /congenital hydrocephalus s/p shunt and ocular migraines and history of optic neuritis who follows up for numbness and tingling.   UPDATE: Last seen in November 2021.  Worked up numbness and paresthesias.  NCV-EMG on 12/31/2019 was normal.  Next step was to check a repeat MRI of brain but patient did not contact our office.  Continues to feel numbness and tingling involving the face, arms and legs.  Now also notes spasms in the extremities or mild tremor in hand.        HISTORY: Numbness and Tingling: She started experiencing numbness and tingling all over her body in 2021.  It is constant.  It involves the hands, the thighs, across the abdomen and right temple.  At one point, she noted a pain described as a "lightening bolt" up her left leg.  Another time, she was at her computer when her vision became blurry and she couldn't focus for 10 minutes.  Different than her ocular migraines.  No associated headache.  PCP checked labs, such as CMP, B12 (371) and thyroid, which were unremarkable.    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  They present as onset of floaters and squiggly lines in the visual field of either eye.  It is associated with unilateral dull non-throbbing headache (above affected eye with aura).  There is no associated nausea, vomiting, photophobia, phonophobia or unilateral numbness or weakness.  Staring at a computer screen may  aggravate it (she works as a Pensions consultant).  Laying down for a couple of minutes help relieve it.  The visual aura lasts 2 minutes but the headache lasts 5 to 15 minutes.  They occur sporadically.  She may have months with no migraine but then they may occur once a month for a few months.   Mild Neurocognitive Disorder, Multifactorial: She was diagnosed with hydrocephalus in her teens.  Her neurosurgeon is Dr. Erline Levine.  She had a new shunt placed on 11/16/11 after malfunction and disconnect.  Her shunt settings are 57mH2O pressure.  Her shunt is working well.  She denies headaches or urinary incontinence.  There has been no worsening of gait.  However, since the shunt malfunction, she and her family have noted mild change in cognition.  Specifically, she seems more slow to respond.  When asked a question, she will often have a blank stare for a moment before answering.  There is no loss of awareness.  Sometimes, when she talks, she knows what she wants to say but has word-finding difficulties.  She has been told that she will sometimes repeat herself to other people.  She does have stress and some anxiety, especially work-related.  She sees a cSocial workerwithout much difference.  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  She is able to read without losing train of thought, without difficulty understanding what she read, and without need to re-read.  She denies difficulty following along when watching TV or a movie.  She underwent neuropsychological testing on 12/23/13, which revealed brain dysfunction in multiple cognitive domains, primarily indicative of posterior right hemisphere dysfunction (non-motor visualspatial processing and organizational tests) and more generalized cortical damage (impairment in auditory memory, semantic fluency and dominant hand fine motor skills).  Based on her longstanding history of need for special education as a child suggests this is  congenital onset brain dysfunction.  It could not be determined if there has been a cognitive decline since shunt revision in 2013.  Over the past couple of years, her mother feels that her memory has gotten worse.  TSH from 01/18/17 was 2.33.  In 2019, she endorsed worsening memory deficits.  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  She also endorsed panic attacks.  B12 from 08/07/17 was 367.  Thyroid testing was normal.  She underwent neuropsychological testing on 08/15/17, demonstrating major neurocognitive disorder which again revealed deficits in visual-spatial construction, memory encoding/retrieval and increased problems.  However, compared to prior neurocognitive testing in 2015, she now demonstrates moderate to severe decline in memory recognition.  With the exception of deductive reasoning, executive functioning and attention were within normal limits.  Overall profile suggests right parietal and left medial temporal dysfunction.  She had a CT of the head on 09/12/17 to evaluate for recurrence of hydrocephalus, which was personally reviewed, but was stable compared to prior CT from 04/25/2014, demonstrating congenital dysplastic features and stable bilateral CSF shunts with no ventriculomegaly.  To assess worsening memory, she had an MRI of the brain with and without contrast on 11/30/2017, which was personally reviewed and showed bilateral shunts and chronic periventricular leukomalacia with volume loss in the bilateral parietal lobes with compensatory enlargement of the atria but no hydrocephalus acute abnormality.  She underwent neuropsychological testing on 06/18/2019, in which she demonstrated some declines across verbal retrieval and learning of unstructured verbal information but also some improvements across executive functioning while other domains were relatively stable.  As she did not endorse ADL dysfunction, she met criteria for mild neurocognitive  disorder due to multiple etiologies, including congenital onset brain dysfunction, history of hydrocephalus, insomnia and depression and anxiety.  PAST MEDICAL HISTORY: Past Medical History:  Diagnosis Date   Acquired hypothyroidism 12/22/2015   Back pain 04/05/2019   Cerebral palsy    Female stress incontinence    Generalized anxiety disorder    Hydrocephalus    Congenital   Insomnia    Laryngopharyngeal reflux (LPR) 03/08/2016   Major depressive disorder    Mild neurocognitive disorder due to multiple etiologies 06/19/2019   Neck pain 04/05/2019   Ocular migraine 05/02/2014   S/P ventriculoperitoneal shunt 05/02/2014   Symptomatic mammary hypertrophy 04/05/2019   VP (ventriculoperitoneal) shunt status     MEDICATIONS: Current Outpatient Medications on File Prior to Visit  Medication Sig Dispense Refill   acetaminophen (TYLENOL) 500 MG tablet Take 1,000 mg by mouth every 6 (six) hours as needed for moderate pain or headache.     cephALEXin (KEFLEX) 500 MG capsule Take 1 capsule (500 mg total) by mouth 3 (three) times daily. 21 capsule 0   ciclopirox (PENLAC) 8 % solution APPLY TOPICALLY AT BEDTIME. APPLY OVER NAIL AND SURROUNDING SKIN. APPLY DAILY OVER PREVIOUS COAT. AFTER 7 DAYS, MAY REMOVE WITH ALCOHOL AND CONTINUE CYCLE. 6.6 mL 2   citalopram (CELEXA) 20 MG tablet Take 10 mg by mouth daily.      hydrOXYzine (ATARAX/VISTARIL) 25 MG tablet Take 25 mg by mouth every 8 (  eight) hours as needed for anxiety.      ibuprofen (ADVIL) 200 MG tablet Take 400 mg by mouth every 6 (six) hours as needed for headache or moderate pain.     levonorgestrel (MIRENA, 52 MG,) 20 MCG/24HR IUD See admin instructions.     levothyroxine (SYNTHROID, LEVOTHROID) 50 MCG tablet Take 50 mcg by mouth daily before breakfast.      valACYclovir (VALTREX) 1000 MG tablet Take 1,000 mg by mouth 2 (two) times daily as needed (cold sores).     No current facility-administered medications on file prior to visit.     ALLERGIES: No Known Allergies  FAMILY HISTORY: Family History  Problem Relation Age of Onset   Hypertension Mother    Depression Maternal Grandfather    CVA Paternal Grandmother    Breast cancer Paternal Grandmother    Ataxia Neg Hx    Chorea Neg Hx    Dementia Neg Hx    Mental retardation Neg Hx    Migraines Neg Hx    Multiple sclerosis Neg Hx    Neurofibromatosis Neg Hx    Neuropathy Neg Hx    Parkinsonism Neg Hx    Seizures Neg Hx    Stroke Neg Hx       Objective:  Blood pressure 138/82, pulse 100, height 5' 2" (1.575 m), weight 149 lb 9.6 oz (67.9 kg), SpO2 94 %. General: No acute distress.  Patient appears well-groomed.   Head:  Normocephalic/atraumatic Eyes:  Fundi examined but not visualized Neck: supple, no paraspinal tenderness, full range of motion Heart:  Regular rate and rhythm Lungs:  Clear to auscultation bilaterally Back: No paraspinal tenderness Neurological Exam: alert and oriented to person, place, and time.  Speech fluent and not dysarthric, language intact.  Endorses reduced sensation to right V1 and left V2-V3.  Some difficulty tracking.  Otherwise, CN II-XII intact. Bulk and tone normal, muscle strength 5/5 throughout.  Sensation to light touch intact.  Deep tendon reflexes 3+ throughout, toes downgoing.  Finger to nose testing intact.  Gait spastic, Romberg negative.   Metta Clines, DO  CC: Marda Stalker, PA-C

## 2020-12-03 ENCOUNTER — Other Ambulatory Visit: Payer: Self-pay

## 2020-12-03 ENCOUNTER — Encounter: Payer: Self-pay | Admitting: Neurology

## 2020-12-03 ENCOUNTER — Telehealth: Payer: Self-pay

## 2020-12-03 ENCOUNTER — Ambulatory Visit: Payer: Medicaid Other | Admitting: Neurology

## 2020-12-03 VITALS — BP 138/82 | HR 100 | Ht 62.0 in | Wt 149.6 lb

## 2020-12-03 DIAGNOSIS — R251 Tremor, unspecified: Secondary | ICD-10-CM

## 2020-12-03 DIAGNOSIS — G919 Hydrocephalus, unspecified: Secondary | ICD-10-CM | POA: Diagnosis not present

## 2020-12-03 DIAGNOSIS — R200 Anesthesia of skin: Secondary | ICD-10-CM | POA: Diagnosis not present

## 2020-12-03 DIAGNOSIS — Z982 Presence of cerebrospinal fluid drainage device: Secondary | ICD-10-CM | POA: Diagnosis not present

## 2020-12-03 DIAGNOSIS — R202 Paresthesia of skin: Secondary | ICD-10-CM

## 2020-12-03 NOTE — Patient Instructions (Signed)
Will need to check MRI of brain with and without contrast - will need to coordinate with neurosurgery so you can go to their office afterwards to have your shunt reprogrammed.  Further recommendations pending results.

## 2020-12-03 NOTE — Telephone Encounter (Signed)
Pt scheduled for MRI 12/17/20 at 8 am arrive at 7:30 am at [facility].   Appt with [facility] 12/17/20 at 1:45 pt to arrive at 1:15 pm. With Dr.Tom Ostergard.   Pt advised.

## 2020-12-11 ENCOUNTER — Ambulatory Visit (HOSPITAL_COMMUNITY): Payer: Medicaid Other

## 2020-12-17 ENCOUNTER — Ambulatory Visit (HOSPITAL_COMMUNITY)
Admission: RE | Admit: 2020-12-17 | Discharge: 2020-12-17 | Disposition: A | Discharge status: Home / Self Care | Payer: Medicaid Other | Source: Ambulatory Visit | Attending: Neurology | Admitting: Neurology

## 2020-12-17 ENCOUNTER — Other Ambulatory Visit: Payer: Self-pay

## 2020-12-17 DIAGNOSIS — R200 Anesthesia of skin: Secondary | ICD-10-CM | POA: Insufficient documentation

## 2020-12-17 DIAGNOSIS — Z982 Presence of cerebrospinal fluid drainage device: Secondary | ICD-10-CM | POA: Diagnosis present

## 2020-12-17 DIAGNOSIS — G919 Hydrocephalus, unspecified: Secondary | ICD-10-CM | POA: Insufficient documentation

## 2020-12-17 DIAGNOSIS — R202 Paresthesia of skin: Secondary | ICD-10-CM | POA: Insufficient documentation

## 2020-12-17 MED ORDER — GADOBUTROL 1 MMOL/ML IV SOLN
7.0000 mL | Freq: Once | INTRAVENOUS | Status: AC | PRN
  Administered 2020-12-17: 09:00:00 7 mL via INTRAVENOUS

## 2021-03-01 ENCOUNTER — Ambulatory Visit: Payer: Medicaid Other | Admitting: Neurology

## 2021-05-10 ENCOUNTER — Ambulatory Visit (INDEPENDENT_AMBULATORY_CARE_PROVIDER_SITE_OTHER): Payer: Medicaid Other | Admitting: Podiatry

## 2021-05-10 DIAGNOSIS — B351 Tinea unguium: Secondary | ICD-10-CM

## 2021-05-10 DIAGNOSIS — Z79899 Other long term (current) drug therapy: Secondary | ICD-10-CM

## 2021-05-12 DIAGNOSIS — B351 Tinea unguium: Secondary | ICD-10-CM | POA: Insufficient documentation

## 2021-05-12 NOTE — Progress Notes (Signed)
Subjective: ?41 year old female presents the office today for concerns of her nails becoming thickened discolored with yellow discoloration.  She agrees with the right big toenail movements, and thickened discolored again.  She states that this is hereditary likely.  Denies any swelling or redness or any drainage.  She has no other concerns today. ? ?Objective: ?AAO x3, NAD ?DP/PT pulses palpable bilaterally, CRT less than 3 seconds ?Right hallux nail is hypertrophic, dystrophic with yellow discoloration.  The other nails also feel discoloration.  No pain in the nails and there is no swelling redness or any drainage.  No open lesions. ?Significant bunion is present bilaterally.  Mild tenderness on the medial first metatarsal head.  No crepitation or restriction of first MPJ range of motion.   ?No pain with calf compression, swelling, warmth, erythema ? ?Assessment: ?Onychomycosis ? ?Plan: ?-All treatment options discussed with the patient including all alternatives, risks, complications.  ?-Discussed different treatment options for nail fungus.  Plan to recheck a CBC and LFT to see if she is able to do Lamisil at this point.  She has been using topical without significant improvement. ?-As a courtesy debrided the nails with any complications or bleeding. ? ?Vivi Barrack DPM ?

## 2021-05-18 LAB — CBC WITH DIFFERENTIAL/PLATELET
Basophils Absolute: 0 10*3/uL (ref 0.0–0.2)
Basos: 0 %
EOS (ABSOLUTE): 0.2 10*3/uL (ref 0.0–0.4)
Eos: 2 %
Hematocrit: 41.4 % (ref 34.0–46.6)
Hemoglobin: 14.2 g/dL (ref 11.1–15.9)
Immature Grans (Abs): 0 10*3/uL (ref 0.0–0.1)
Immature Granulocytes: 0 %
Lymphocytes Absolute: 3 10*3/uL (ref 0.7–3.1)
Lymphs: 32 %
MCH: 30 pg (ref 26.6–33.0)
MCHC: 34.3 g/dL (ref 31.5–35.7)
MCV: 87 fL (ref 79–97)
Monocytes Absolute: 0.5 10*3/uL (ref 0.1–0.9)
Monocytes: 5 %
Neutrophils Absolute: 5.7 10*3/uL (ref 1.4–7.0)
Neutrophils: 61 %
Platelets: 348 10*3/uL (ref 150–450)
RBC: 4.74 x10E6/uL (ref 3.77–5.28)
RDW: 11.7 % (ref 11.7–15.4)
WBC: 9.4 10*3/uL (ref 3.4–10.8)

## 2021-05-18 LAB — HEPATIC FUNCTION PANEL
ALT: 24 IU/L (ref 0–32)
AST: 28 IU/L (ref 0–40)
Albumin: 4.6 g/dL (ref 3.8–4.8)
Alkaline Phosphatase: 128 IU/L — ABNORMAL HIGH (ref 44–121)
Bilirubin Total: 0.3 mg/dL (ref 0.0–1.2)
Bilirubin, Direct: <0.1 mg/dL (ref 0.00–0.40)
Total Protein: 7.3 g/dL (ref 6.0–8.5)

## 2021-05-21 ENCOUNTER — Encounter: Payer: Self-pay | Admitting: Podiatry

## 2021-06-02 ENCOUNTER — Other Ambulatory Visit: Payer: Self-pay | Admitting: Podiatry

## 2021-07-06 NOTE — Progress Notes (Signed)
NEUROLOGY FOLLOW UP OFFICE NOTE  Sophia Marks [id number]  Assessment/Plan:   Paresthesias - no peripheral or central neurologic etiology identified.  Given negative workup and endorsement of increased anxiety, symptoms may be psychosomatic   Will check vit D level and repeat B12 level (as prior labs were in the low normal range).  Further recommendations pending results. Recommend following up with PCP to discuss change in treatment of anxiety and sleep disturbance Follow up as needed.   Subjective:  Sophia Marks is a 41 year old right-handed woman with cerebral palsy /congenital hydrocephalus s/p shunt and ocular migraines and history of optic neuritis who follows up for numbness and tingling.   UPDATE: Continued to feel numbness and tingling involving the face, arms and legs.  Now also notes spasms in the extremities or mild tremor in hand.  MRI of brain with and without contrast on 12/17/2020 personally reviewed showed stable shunt placement and ventricular size without acute intracranial abnormalities.      She reports increased anxiety.  She has been doubling her dose of hydroxyzine.  She also notes difficulty with sleep.  She often works at night and will sleep during day.    HISTORY: Numbness and Tingling: She started experiencing numbness and tingling all over her body in 2021.  It is constant.  It involves the hands, the thighs, across the abdomen and right temple.  At one point, she noted a pain described as a "lightening bolt" up her left leg.  Another time, she was at her computer when her vision became blurry and she couldn't focus for 10 minutes.  Different than her ocular migraines.  No associated headache.  PCP checked labs, such as CMP, B12 (371) and thyroid, which were unremarkable.  NCV-EMG on 12/31/2019 was normal.   Ocular Migraines: She has had ocular migraines since age 68.  They present as onset of floaters and squiggly lines in the visual field of either eye.  It  is associated with unilateral dull non-throbbing headache (above affected eye with aura).  There is no associated nausea, vomiting, photophobia, phonophobia or unilateral numbness or weakness.  Staring at a computer screen may aggravate it (she works as a Pensions consultant).  Laying down for a couple of minutes help relieve it.  The visual aura lasts 2 minutes but the headache lasts 5 to 15 minutes.  They occur sporadically.  She may have months with no migraine but then they may occur once a month for a few months.   Mild Neurocognitive Disorder, Multifactorial: She was diagnosed with hydrocephalus in her teens.  Her neurosurgeon is Dr. Erline Levine.  She had a new shunt placed on 11/16/11 after malfunction and disconnect.  Her shunt settings are 3mH2O pressure.  Her shunt is working well.  She denies headaches or urinary incontinence.  There has been no worsening of gait.  However, since the shunt malfunction, she and her family have noted mild change in cognition.  Specifically, she seems more slow to respond.  When asked a question, she will often have a blank stare for a moment before answering.  There is no loss of awareness.  Sometimes, when she talks, she knows what she wants to say but has word-finding difficulties.  She has been told that she will sometimes repeat herself to other people.  She does have stress and some anxiety, especially work-related.  She sees a cSocial workerwithout much difference.  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  She is able to read without losing train of thought, without difficulty understanding what she read, and without need to re-read.  She denies difficulty following along when watching TV or a movie. She underwent neuropsychological testing on 12/23/13, which revealed brain dysfunction in multiple cognitive domains, primarily indicative of posterior right hemisphere dysfunction (non-motor visualspatial processing and  organizational tests) and more generalized cortical damage (impairment in auditory memory, semantic fluency and dominant hand fine motor skills).  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  It could not be determined if there has been a cognitive decline since shunt revision in 2013.  Over the past couple of years, her mother feels that her memory has gotten worse.  TSH from 01/18/17 was 2.33.  In 2019, she endorsed worsening memory deficits.  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  She also endorsed panic attacks.  B12 from 08/07/17 was 367.  Thyroid testing was normal.  She underwent neuropsychological testing on 08/15/17, demonstrating major neurocognitive disorder which again revealed deficits in visual-spatial construction, memory encoding/retrieval and increased problems.  However, compared to prior neurocognitive testing in 2015, she now demonstrates moderate to severe decline in memory recognition.  With the exception of deductive reasoning, executive functioning and attention were within normal limits.  Overall profile suggests right parietal and left medial temporal dysfunction.  She had a CT of the head on 09/12/17 to evaluate for recurrence of hydrocephalus, which was personally reviewed, but was stable compared to prior CT from 04/25/2014, demonstrating congenital dysplastic features and stable bilateral CSF shunts with no ventriculomegaly.  To assess worsening memory, she had an MRI of the brain with and without contrast on 11/30/2017, which was personally reviewed and showed bilateral shunts and chronic periventricular leukomalacia with volume loss in the bilateral parietal lobes with compensatory enlargement of the atria but no hydrocephalus acute abnormality.  She underwent neuropsychological testing on 06/18/2019, in which she demonstrated some declines across verbal  retrieval and learning of unstructured verbal information but also some improvements across executive functioning while other domains were relatively stable.  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  PAST MEDICAL HISTORY: Past Medical History:  Diagnosis Date   Acquired hypothyroidism 12/22/2015   Back pain 04/05/2019   Cerebral palsy    Female stress incontinence    Generalized anxiety disorder    Hydrocephalus    Congenital   Insomnia    Laryngopharyngeal reflux (LPR) 03/08/2016   Major depressive disorder    Mild neurocognitive disorder due to multiple etiologies 06/19/2019   Neck pain 04/05/2019   Ocular migraine 05/02/2014   S/P ventriculoperitoneal shunt 05/02/2014   Symptomatic mammary hypertrophy 04/05/2019   VP (ventriculoperitoneal) shunt status     MEDICATIONS: Current Outpatient Medications on File Prior to Visit  Medication Sig Dispense Refill   acetaminophen (TYLENOL) 500 MG tablet Take 1,000 mg by mouth every 6 (six) hours as needed for moderate pain or headache.     ciclopirox (PENLAC) 8 % solution APPLY TOPICALLY AT BEDTIME. APPLY OVER NAIL AND SURROUNDING SKIN. APPLY DAILY OVER PREVIOUS COAT. AFTER 7 DAYS, MAY REMOVE WITH ALCOHOL AND CONTINUE CYCLE. 6.6 mL 2   citalopram (CELEXA) 20 MG tablet Take 10 mg by mouth daily.  hydrOXYzine (ATARAX/VISTARIL) 25 MG tablet Take 25 mg by mouth every 8 (eight) hours as needed for anxiety.      ibuprofen (ADVIL) 200 MG tablet Take 400 mg by mouth every 6 (six) hours as needed for headache or moderate pain.     levonorgestrel (MIRENA, 52 MG,) 20 MCG/24HR IUD See admin instructions.     levothyroxine (SYNTHROID, LEVOTHROID) 50 MCG tablet Take 50 mcg by mouth daily before breakfast.      valACYclovir (VALTREX) 1000 MG tablet Take 1,000 mg by mouth 2 (two)  times daily as needed (cold sores).     No current facility-administered medications on file prior to visit.    ALLERGIES: No Known Allergies  FAMILY HISTORY: Family History  Problem Relation Age of Onset   Hypertension Mother    Depression Maternal Grandfather    CVA Paternal Grandmother    Breast cancer Paternal Grandmother    Ataxia Neg Hx    Chorea Neg Hx    Dementia Neg Hx    Mental retardation Neg Hx    Migraines Neg Hx    Multiple sclerosis Neg Hx    Neurofibromatosis Neg Hx    Neuropathy Neg Hx    Parkinsonism Neg Hx    Seizures Neg Hx    Stroke Neg Hx       Objective:  Blood pressure 122/81, pulse 92, height 5' 2" (1.575 m), weight 137 lb (62.1 kg), SpO2 98 %. General: No acute distress.  Patient appears well-groomed.   Head:  Normocephalic/atraumatic Eyes:  Fundi examined but not visualized Heart:  Regular rate and rhythm Neurological Exam: alert and oriented to person, place, and time.  Speech fluent and not dysarthric, language intact.  CN II-XII intact. Mildly increased tone, muscle strength 5/5 throughout.  Sensation to light touch intact.  Deep tendon reflexes 3+ throughout, toes downgoing.  Finger to nose testing intact. Spastic gait., Romberg negative.   Metta Clines, DO  CC: Marda Stalker, PA-C

## 2021-07-07 ENCOUNTER — Encounter: Payer: Self-pay | Admitting: Neurology

## 2021-07-07 ENCOUNTER — Ambulatory Visit: Payer: Medicaid Other | Admitting: Neurology

## 2021-07-07 VITALS — BP 122/81 | HR 92 | Ht 62.0 in | Wt 137.0 lb

## 2021-07-07 DIAGNOSIS — R200 Anesthesia of skin: Secondary | ICD-10-CM | POA: Diagnosis not present

## 2021-07-07 DIAGNOSIS — R202 Paresthesia of skin: Secondary | ICD-10-CM

## 2021-07-07 NOTE — Patient Instructions (Signed)
Check vit d level and repeat B12 level Follow up with your PCP regarding anxiety and sleep difficulty

## 2021-07-08 ENCOUNTER — Other Ambulatory Visit (INDEPENDENT_AMBULATORY_CARE_PROVIDER_SITE_OTHER): Payer: Medicaid Other

## 2021-07-08 DIAGNOSIS — R202 Paresthesia of skin: Secondary | ICD-10-CM | POA: Diagnosis not present

## 2021-07-08 DIAGNOSIS — R200 Anesthesia of skin: Secondary | ICD-10-CM

## 2021-07-08 LAB — VITAMIN B12: Vitamin B-12: 491 pg/mL (ref 211–911)

## 2021-07-08 LAB — VITAMIN D 25 HYDROXY (VIT D DEFICIENCY, FRACTURES): VITD: 36.04 ng/mL (ref 30.00–100.00)

## 2021-09-05 ENCOUNTER — Encounter: Payer: Self-pay | Admitting: Podiatry

## 2021-09-11 ENCOUNTER — Other Ambulatory Visit: Payer: Self-pay | Admitting: Podiatry

## 2021-09-11 MED ORDER — EFINACONAZOLE 10 % EX SOLN: 1.0000 [drp] | Freq: Every day | CUTANEOUS | 11 refills | Status: DC

## 2021-09-16 NOTE — Telephone Encounter (Signed)
Sophia Marks, have you seen this? Thanks

## 2021-09-17 ENCOUNTER — Telehealth: Payer: Self-pay

## 2021-09-20 NOTE — Telephone Encounter (Signed)
PA was denied on 09/17/21

## 2021-10-29 ENCOUNTER — Other Ambulatory Visit: Payer: Self-pay | Admitting: Family Medicine

## 2021-10-29 ENCOUNTER — Ambulatory Visit
Admission: RE | Admit: 2021-10-29 | Discharge: 2021-10-29 | Disposition: A | Discharge status: Home / Self Care | Payer: Medicaid Other | Source: Ambulatory Visit | Attending: Family Medicine | Admitting: Family Medicine

## 2021-10-29 DIAGNOSIS — M25571 Pain in right ankle and joints of right foot: Secondary | ICD-10-CM

## 2021-11-18 ENCOUNTER — Encounter (HOSPITAL_BASED_OUTPATIENT_CLINIC_OR_DEPARTMENT_OTHER): Payer: Self-pay | Admitting: Obstetrics and Gynecology

## 2021-11-18 NOTE — Progress Notes (Signed)
Spoke w/ via phone for pre-op interview---Sophia Marks  Lab needs dos----  UPT, CBC, CMP             Lab results------N/A COVID test -----patient states asymptomatic no test needed Arrive at -------0530 NPO after MN NO Solid Food.  Clear liquids from MN until---0430 Med rec completed Medications to take morning of surgery -----Thyroid med Diabetic medication -----N/A Patient instructed no nail polish to be worn day of surgery Patient instructed to bring photo id and insurance card day of surgery Patient aware to have Driver (ride ) / caregiver    for 24 hours after surgery (Mom) [name] Patient Special Instructions -----CP with hydrocephalus shunt, leg are hard to position, wears brace on leg Pre-Op special Istructions -----place on stretcher Patient verbalized understanding of instructions that were given at this phone interview. Patient denies shortness of breath, chest pain, fever, cough at this phone interview.

## 2021-11-25 NOTE — Anesthesia Preprocedure Evaluation (Addendum)
Anesthesia Evaluation  Patient identified by MRN, date of birth, ID band Patient awake  General Assessment Comment:Hx noted Dr. Nyoka Cowden  Reviewed: Allergy & Precautions, NPO status , Patient's Chart, lab work & pertinent test results  Airway Mallampati: II       Dental   Pulmonary neg pulmonary ROS,    breath sounds clear to auscultation       Cardiovascular hypertension,  Rhythm:Regular Rate:Normal     Neuro/Psych  Headaches, PSYCHIATRIC DISORDERS Hx noted Dr. Nyoka Cowden    GI/Hepatic negative GI ROS, Neg liver ROS,   Endo/Other  Hypothyroidism   Renal/GU negative Renal ROS     Musculoskeletal   Abdominal   Peds  Hematology   Anesthesia Other Findings   Reproductive/Obstetrics                          Anesthesia Physical Anesthesia Plan  ASA: 3  Anesthesia Plan: General   Post-op Pain Management: Tylenol PO (pre-op)*   Induction: Intravenous  PONV Risk Score and Plan: 3 and Ondansetron, Dexamethasone and Midazolam  Airway Management Planned: LMA  Additional Equipment:   Intra-op Plan:   Post-operative Plan: Extubation in OR  Informed Consent: I have reviewed the patients History and Physical, chart, labs and discussed the procedure including the risks, benefits and alternatives for the proposed anesthesia with the patient or authorized representative who has indicated his/her understanding and acceptance.     Dental advisory given  Plan Discussed with: CRNA and Anesthesiologist  Anesthesia Plan Comments:        Anesthesia Quick Evaluation

## 2021-11-25 NOTE — H&P (Signed)
Sophia Marks is an 41 y.o. female G0 presenting for scheduled ablation given ongoing issues with menorrhagia and dysmenorrhea.   Pt has reported heavy menses for years, she reports monthly heavy bleeding lasting 3 days. She also reports dysmenorrhea that is significant. She had an IUD but still had monthly menses lasting up to 10 days and had sharp pain that resolved when IUD removed.  She also reports cramping frequently and not always associated with her period. She is not sexually active. She has CHTN and iscontrolled on lisinopril, as well as hypothyroidism. She has hydrocephalus with a shunt that ends in her pelvis near her uterus.  This was cleared by neurology as appropriate and not thought to be related to her pain.  Pertinent Gynecological History: above  Menstrual History: \ Patient's last menstrual period was 11/02/2021 (exact date).    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   S/P ventriculoperitoneal shunt 05/02/2014   Symptomatic mammary hypertrophy 04/05/2019   VP (ventriculoperitoneal) shunt status     Past Surgical History:  Procedure Laterality Date   BRAIN SURGERY     BREAST REDUCTION SURGERY Bilateral 01/01/2020   Procedure: MAMMARY REDUCTION  (BREAST);  Surgeon: Peggye Form, DO;  Location: [facility] OR;  Service: Plastics;  Laterality: Bilateral;  3 hours   heal surgery     VENTRICULOPERITONEAL SHUNT  11/16/2011   Procedure: SHUNT INSERTION VENTRICULAR-PERITONEAL;  Surgeon: Maeola Harman, MD;  Location: [facility];  Service: Neurosurgery;  Laterality: Right;  Ventricular  Peritoneal Shunt Insertion/Revision with Laparoscopic Abdominal Approach    Family History  Problem Relation Age of Onset   Hypertension Mother    Depression Maternal Grandfather    CVA Paternal Grandmother    Breast cancer Paternal Grandmother    Ataxia Neg Hx    [place] Neg Hx    Dementia Neg Hx    Mental retardation Neg Hx    Migraines Neg Hx    Multiple sclerosis Neg Hx    Neurofibromatosis Neg Hx    Neuropathy Neg Hx    Parkinsonism Neg Hx    Seizures Neg Hx    Stroke Neg Hx     Social History:  reports that she has never smoked. She has never used smokeless tobacco. She reports current alcohol use. She reports that she does not use drugs.  Allergies:  Allergies  Allergen Reactions   Tape     Opsite, rash and itching    No medications prior to admission.    Review of Systems  Constitutional:  Negative for fever.  Genitourinary:  Positive for menstrual problem and pelvic pain.    Height 5\' 2"  (1.575 m), weight 56.7 kg, last menstrual period 11/02/2021. Physical Exam Cardiovascular:     Rate and Rhythm: Normal rate and regular rhythm.  Pulmonary:     Effort: Pulmonary effort is normal.  Abdominal:     General: Abdomen is flat.     Palpations: Abdomen is soft.  Genitourinary:    General: Normal vulva.  Musculoskeletal:        General: Deformity present.  Neurological:     Mental Status: She is alert.     Coordination: Coordination abnormal.     Gait: Gait abnormal.     Deep Tendon Reflexes: Reflexes abnormal.  Psychiatric:        Mood and Affect: Mood normal.     No results found for this or any previous visit (from the past 24 hour(s)).  No results found.  Assessment/Plan: The patient was counseled on the novasure procedure in detail.  The risks of bleeding and infection and possible uterine perforation were reviewed.  We discussed that the procedure will usually reduce bleeding significantly, but may not eliminate periods. It also will not  necessarily help her pelvic pain. We also discussed that she should not perform this procedure if she desires any future pregnancies.  She plans no future childbearing and is not in a current relationship. We discussed that with no prior pregnancies, it is possible the cavity might be too narrow for the device and prohibit treatment. Pt aware and agreeable.   Logan Bores 11/25/2021, 8:38 PM

## 2021-11-26 ENCOUNTER — Ambulatory Visit (HOSPITAL_BASED_OUTPATIENT_CLINIC_OR_DEPARTMENT_OTHER): Payer: Medicaid Other | Admitting: Anesthesiology

## 2021-11-26 ENCOUNTER — Other Ambulatory Visit: Payer: Self-pay

## 2021-11-26 ENCOUNTER — Encounter (HOSPITAL_BASED_OUTPATIENT_CLINIC_OR_DEPARTMENT_OTHER): Payer: Self-pay | Admitting: Obstetrics and Gynecology

## 2021-11-26 ENCOUNTER — Encounter (HOSPITAL_BASED_OUTPATIENT_CLINIC_OR_DEPARTMENT_OTHER)
Admission: RE | Disposition: A | Discharge status: Home / Self Care | Payer: Self-pay | Source: Home / Self Care | Attending: Obstetrics and Gynecology

## 2021-11-26 ENCOUNTER — Ambulatory Visit (HOSPITAL_BASED_OUTPATIENT_CLINIC_OR_DEPARTMENT_OTHER)
Admission: RE | Admit: 2021-11-26 | Discharge: 2021-11-26 | Disposition: A | Discharge status: Home / Self Care | Payer: Medicaid Other | Attending: Obstetrics and Gynecology | Admitting: Obstetrics and Gynecology

## 2021-11-26 DIAGNOSIS — E039 Hypothyroidism, unspecified: Secondary | ICD-10-CM | POA: Insufficient documentation

## 2021-11-26 DIAGNOSIS — N920 Excessive and frequent menstruation with regular cycle: Secondary | ICD-10-CM | POA: Diagnosis not present

## 2021-11-26 DIAGNOSIS — I10 Essential (primary) hypertension: Secondary | ICD-10-CM | POA: Insufficient documentation

## 2021-11-26 DIAGNOSIS — Z01818 Encounter for other preprocedural examination: Secondary | ICD-10-CM

## 2021-11-26 DIAGNOSIS — Z79899 Other long term (current) drug therapy: Secondary | ICD-10-CM | POA: Diagnosis not present

## 2021-11-26 DIAGNOSIS — N946 Dysmenorrhea, unspecified: Secondary | ICD-10-CM | POA: Diagnosis not present

## 2021-11-26 DIAGNOSIS — Z982 Presence of cerebrospinal fluid drainage device: Secondary | ICD-10-CM | POA: Insufficient documentation

## 2021-11-26 DIAGNOSIS — G919 Hydrocephalus, unspecified: Secondary | ICD-10-CM | POA: Diagnosis not present

## 2021-11-26 HISTORY — PX: DILITATION & CURRETTAGE/HYSTROSCOPY WITH NOVASURE ABLATION: SHX5568

## 2021-11-26 HISTORY — DX: Essential (primary) hypertension: I10

## 2021-11-26 LAB — CBC
HCT: 40.4 % (ref 36.0–46.0)
Hemoglobin: 13.4 g/dL (ref 12.0–15.0)
MCH: 30.3 pg (ref 26.0–34.0)
MCHC: 33.2 g/dL (ref 30.0–36.0)
MCV: 91.4 fL (ref 80.0–100.0)
Platelets: 272 10*3/uL (ref 150–400)
RBC: 4.42 MIL/uL (ref 3.87–5.11)
RDW: 12 % (ref 11.5–15.5)
WBC: 9.8 10*3/uL (ref 4.0–10.5)
nRBC: 0 % (ref 0.0–0.2)

## 2021-11-26 LAB — POCT I-STAT, CHEM 8
BUN: 16 mg/dL (ref 6–20)
Calcium, Ion: 1.06 mmol/L — ABNORMAL LOW (ref 1.15–1.40)
Chloride: 109 mmol/L (ref 98–111)
Creatinine, Ser: 0.6 mg/dL (ref 0.44–1.00)
Glucose, Bld: 80 mg/dL (ref 70–99)
HCT: 41 % (ref 36.0–46.0)
Hemoglobin: 13.9 g/dL (ref 12.0–15.0)
Potassium: 3.7 mmol/L (ref 3.5–5.1)
Sodium: 139 mmol/L (ref 135–145)
TCO2: 22 mmol/L (ref 22–32)

## 2021-11-26 LAB — COMPREHENSIVE METABOLIC PANEL
ALT: 20 U/L (ref 0–44)
AST: 19 U/L (ref 15–41)
Albumin: 4 g/dL (ref 3.5–5.0)
Alkaline Phosphatase: 86 U/L (ref 38–126)
Anion gap: 8 (ref 5–15)
BUN: 18 mg/dL (ref 6–20)
CO2: 23 mmol/L (ref 22–32)
Calcium: 8.9 mg/dL (ref 8.9–10.3)
Chloride: 108 mmol/L (ref 98–111)
Creatinine, Ser: 0.67 mg/dL (ref 0.44–1.00)
GFR, Estimated: >60 mL/min (ref >60)
Glucose, Bld: 86 mg/dL (ref 70–99)
Potassium: 3.7 mmol/L (ref 3.5–5.1)
Sodium: 139 mmol/L (ref 135–145)
Total Bilirubin: 0.6 mg/dL (ref 0.3–1.2)
Total Protein: 7.3 g/dL (ref 6.5–8.1)

## 2021-11-26 LAB — POCT PREGNANCY, URINE: Preg Test, Ur: NEGATIVE

## 2021-11-26 SURGERY — DILATATION & CURETTAGE/HYSTEROSCOPY WITH NOVASURE ABLATION
Anesthesia: General | Site: Uterus

## 2021-11-26 MED ORDER — DEXAMETHASONE SODIUM PHOSPHATE 10 MG/ML IJ SOLN
INTRAMUSCULAR | Status: DC | PRN
  Administered 2021-11-26: 10 mg via INTRAVENOUS

## 2021-11-26 MED ORDER — MIDAZOLAM HCL 2 MG/2ML IJ SOLN
INTRAMUSCULAR | Status: DC | PRN
  Administered 2021-11-26: 2 mg via INTRAVENOUS

## 2021-11-26 MED ORDER — PHENYLEPHRINE 80 MCG/ML (10ML) SYRINGE FOR IV PUSH (FOR BLOOD PRESSURE SUPPORT)
PREFILLED_SYRINGE | INTRAVENOUS | Status: DC | PRN
  Administered 2021-11-26: 160 ug via INTRAVENOUS
  Administered 2021-11-26: 80 ug via INTRAVENOUS

## 2021-11-26 MED ORDER — IBUPROFEN 200 MG PO TABS: 600.0000 mg | ORAL_TABLET | Freq: Four times a day (QID) | ORAL | 0 refills | Status: AC | PRN

## 2021-11-26 MED ORDER — LIDOCAINE HCL 1 % IJ SOLN
INTRAMUSCULAR | Status: DC | PRN
  Administered 2021-11-26: 10 mL

## 2021-11-26 MED ORDER — LACTATED RINGERS IV SOLN: INTRAVENOUS | Status: DC

## 2021-11-26 MED ORDER — ONDANSETRON HCL 4 MG/2ML IJ SOLN
INTRAMUSCULAR | Status: DC | PRN
  Administered 2021-11-26: 4 mg via INTRAVENOUS

## 2021-11-26 MED ORDER — KETOROLAC TROMETHAMINE 30 MG/ML IJ SOLN
INTRAMUSCULAR | Status: DC | PRN
  Administered 2021-11-26: 30 mg via INTRAVENOUS

## 2021-11-26 MED ORDER — ONDANSETRON HCL 4 MG PO TABS: 4.0000 mg | ORAL_TABLET | Freq: Three times a day (TID) | ORAL | 0 refills | Status: DC | PRN

## 2021-11-26 MED ORDER — OXYCODONE HCL 5 MG PO TABS: 5.0000 mg | ORAL_TABLET | ORAL | 0 refills | Status: DC | PRN

## 2021-11-26 MED ORDER — SODIUM CHLORIDE 0.9 % IR SOLN
Status: DC | PRN
  Administered 2021-11-26: 3000 mL

## 2021-11-26 MED ORDER — MIDAZOLAM HCL 2 MG/2ML IJ SOLN
INTRAMUSCULAR | Status: AC
  Filled 2021-11-26: qty 2

## 2021-11-26 MED ORDER — POVIDONE-IODINE 10 % EX SWAB: 2.0000 | Freq: Once | CUTANEOUS | Status: DC

## 2021-11-26 MED ORDER — PROPOFOL 10 MG/ML IV BOLUS
INTRAVENOUS | Status: AC
  Filled 2021-11-26: qty 20

## 2021-11-26 MED ORDER — PROPOFOL 10 MG/ML IV BOLUS
INTRAVENOUS | Status: DC | PRN
  Administered 2021-11-26: 180 mg via INTRAVENOUS

## 2021-11-26 MED ORDER — HYDROMORPHONE HCL 1 MG/ML IJ SOLN: 0.2500 mg | INTRAMUSCULAR | Status: DC | PRN

## 2021-11-26 MED ORDER — FENTANYL CITRATE (PF) 250 MCG/5ML IJ SOLN
INTRAMUSCULAR | Status: DC | PRN
  Administered 2021-11-26: 50 ug via INTRAVENOUS

## 2021-11-26 MED ORDER — FENTANYL CITRATE (PF) 100 MCG/2ML IJ SOLN
INTRAMUSCULAR | Status: AC
  Filled 2021-11-26: qty 2

## 2021-11-26 MED ORDER — EPHEDRINE SULFATE (PRESSORS) 50 MG/ML IJ SOLN
INTRAMUSCULAR | Status: DC | PRN
  Administered 2021-11-26 (×2): 5 mg via INTRAVENOUS

## 2021-11-26 MED ORDER — LIDOCAINE 2% (20 MG/ML) 5 ML SYRINGE
INTRAMUSCULAR | Status: DC | PRN
  Administered 2021-11-26: 80 mg via INTRAVENOUS

## 2021-11-26 SURGICAL SUPPLY — 16 items
ABLATOR SURESOUND NOVASURE (ABLATOR) IMPLANT
CATH ROBINSON RED A/P 16FR (CATHETERS) ×1 IMPLANT
ELECT REM PT RETURN 9FT ADLT (ELECTROSURGICAL)
ELECTRODE REM PT RTRN 9FT ADLT (ELECTROSURGICAL) IMPLANT
GLOVE BIO SURGEON STRL SZ 6.5 (GLOVE) ×1 IMPLANT
GLOVE BIO SURGEON STRL SZ7 (GLOVE) ×1 IMPLANT
GLOVE BIOGEL PI IND STRL 6 (GLOVE) IMPLANT
GLOVE BIOGEL PI IND STRL 6.5 (GLOVE) IMPLANT
GOWN STRL REUS W/TWL LRG LVL3 (GOWN DISPOSABLE) ×2 IMPLANT
IV NS IRRIG 3000ML ARTHROMATIC (IV SOLUTION) ×1 IMPLANT
KIT PROCEDURE FLUENT (KITS) ×1 IMPLANT
KIT TURNOVER CYSTO (KITS) ×1 IMPLANT
PACK VAGINAL MINOR WOMEN LF (CUSTOM PROCEDURE TRAY) ×1 IMPLANT
PAD OB MATERNITY 4.3X12.25 (PERSONAL CARE ITEMS) ×1 IMPLANT
SEAL ROD LENS SCOPE MYOSURE (ABLATOR) ×1 IMPLANT
TOWEL OR 17X26 10 PK STRL BLUE (TOWEL DISPOSABLE) ×1 IMPLANT

## 2021-11-26 NOTE — Transfer of Care (Signed)
Immediate Anesthesia Transfer of Care Note  Patient: Sophia Marks  Procedure(s) Performed: DILATATION & CURETTAGE/HYSTEROSCOPY WITH NOVASURE ABLATION (Uterus)  Patient Location: PACU  Anesthesia Type:General  Level of Consciousness: awake, alert  and oriented  Airway & Oxygen Therapy: Patient Spontanous Breathing  Post-op Assessment: Report given to RN and Post -op Vital signs reviewed and stable  Post vital signs: Reviewed and stable  Last Vitals:  Vitals Value Taken Time  BP 124/68 11/26/21 0824  Temp 36.9 C 11/26/21 0824  Pulse 108 11/26/21 0828  Resp 12 11/26/21 0828  SpO2 97 % 11/26/21 0828  Vitals shown include unvalidated device data.  Last Pain:  Vitals:   11/26/21 0618  TempSrc: Oral  PainSc: 3       Patients Stated Pain Goal: 5 (82/42/35 [id number])  Complications: No notable events documented.

## 2021-11-26 NOTE — Discharge Instructions (Signed)
  Post Anesthesia Home Care Instructions  Activity: Get plenty of rest for the remainder of the day. A responsible individual must stay with you for 24 hours following the procedure.  For the next 24 hours, DO NOT: -Drive a car -Paediatric nurse -Drink alcoholic beverages -Take any medication unless instructed by your physician -Make any legal decisions or sign important papers.  Meals: Start with liquid foods such as gelatin or soup. Progress to regular foods as tolerated. Avoid greasy, spicy, heavy foods. If nausea and/or vomiting occur, drink only clear liquids until the nausea and/or vomiting subsides. Call your physician if vomiting continues.  Special Instructions/Symptoms: Your throat may feel dry or sore from the anesthesia or the breathing tube placed in your throat during surgery. If this causes discomfort, gargle with warm salt water. The discomfort should disappear within 24 hours.  If you had a scopolamine patch placed behind your ear for the management of post- operative nausea and/or vomiting:  1. The medication in the patch is effective for 72 hours, after which it should be removed.  Wrap patch in a tissue and discard in the trash. Wash hands thoroughly with soap and water. 2. You may remove the patch earlier than 72 hours if you experience unpleasant side effects which may include dry mouth, dizziness or visual disturbances. 3. Avoid touching the patch. Wash your hands with soap and water after contact with the patch.     DISCHARGE INSTRUCTIONS: HYSTEROSCOPY / ENDOMETRIAL ABLATION The following instructions have been prepared to help you care for yourself upon your return home.  May take stool softner while taking narcotic pain medication to prevent constipation.  Drink plenty of water.  Personal hygiene:  Use sanitary pads for vaginal drainage, not tampons.  Shower the day after your procedure.  NO tub baths, pools or Jacuzzis for 2-3 weeks.  Wipe front to back  after using the bathroom.  Activity and limitations:  Do NOT drive or operate any equipment for 24 hours. The effects of anesthesia are still present and drowsiness may result.  Do NOT rest in bed all day.  Walking is encouraged.  Walk up and down stairs slowly.  You may resume your normal activity in one to two days or as indicated by your physician. Sexual activity: NO intercourse for at least 2 weeks after the procedure, or as indicated by your Doctor.  Diet: Eat a light meal as desired this evening. You may resume your usual diet tomorrow.  Return to Work: You may resume your work activities in one to two days or as indicated by Marine scientist.  What to expect after your surgery: Expect to have vaginal bleeding/discharge for 2-3 days and spotting for up to 10 days. It is not unusual to have soreness for up to 1-2 weeks. You may have a slight burning sensation when you urinate for the first day. Mild cramps may continue for a couple of days. You may have a regular period in 2-6 weeks.  Call your doctor for any of the following:  Excessive vaginal bleeding or clotting, saturating and changing one pad every hour.  Inability to urinate 6 hours after discharge from hospital.  Pain not relieved by pain medication.  Fever of 100.4 F or greater.  Unusual vaginal discharge or odor.

## 2021-11-26 NOTE — Anesthesia Postprocedure Evaluation (Signed)
Anesthesia Post Note  Patient: Sophia Marks  Procedure(s) Performed: DILATATION & CURETTAGE/HYSTEROSCOPY WITH NOVASURE ABLATION (Uterus)     Patient location during evaluation: PACU Anesthesia Type: General Level of consciousness: awake Pain management: pain level controlled Vital Signs Assessment: post-procedure vital signs reviewed and stable Respiratory status: spontaneous breathing Cardiovascular status: stable Postop Assessment: no apparent nausea or vomiting Anesthetic complications: no   No notable events documented.  Last Vitals:  Vitals:   11/26/21 0902 11/26/21 0928  BP:  115/83  Pulse: 90 92  Resp:  17  Temp:  36.6 C  SpO2:  96%    Last Pain:  Vitals:   11/26/21 0928  TempSrc:   PainSc: 4                  Sohil Timko

## 2021-11-26 NOTE — Op Note (Signed)
Operative Note    Preoperative Diagnosis Menorrhagia Dysmenorrhea  Postoperative Diagnosis same  Procedure Hysteroscopy with Novasure ablation  Surgeon Paula Compton, MD  Anesthesia LMA  Fluids: EBL 37mL UOP voided prior to procedure IVF 618mL Hysteroscopic deficit 1107mL  Findings The uterine cavity was normal, bilateral ostia seen  Specimen None  Procedure Note  Patient was taken to the operating room where LMA anesthesia was obtained without difficulty. She was then prepped and draped in the normal sterile fashion in the dorsal lithotomy position. An appropriate time out was performed. A speculum was then placed within the vagina and the anterior lip of the cervix identified and injected with approximately 2 cc of 1% plain lidocaine. An additional 9 cc each was placed at 2 and 10:00 for a paracervical block. Uterus was then sounded to 7.5 cm and the cervical length measured at 3.5 cm.  The Pratt dilators utilized to dilate the cervix up to approximately 21. The hysteroscope was introduced into the cavity and the findings noted as previously stated.  The hysteroscope was then removed and the Novasure device inserted to the top of the fundus and deployed.  A cavity width of 2.5 was noted c/w with her nulliparous state.  The device was activated with a treatment time of 76 secs. The hysteroscope was then replaced and the cavity noted to have a good treatment effect with blanching and no viable endometrium apparent.  All instruments were removed from the vagina.  The tenaculum site was hemostatic.  Finally the speculum was removed from the vagina and the patient awakened and taken to the recovery room in good condition.

## 2021-11-26 NOTE — Interval H&P Note (Signed)
History and Physical Interval Note:  11/26/2021 7:12 AM  Sophia Marks  has presented today for surgery, with the diagnosis of Menorrhagia.  The various methods of treatment have been discussed with the patient and family. After consideration of risks, benefits and other options for treatment, the patient has consented to  Procedure(s): [place] (N/A) as a surgical intervention.  The patient's history has been reviewed, patient examined, no change in status, stable for surgery.  I have reviewed the patient's chart and labs.  Questions were answered to the patient's satisfaction.     Logan Bores

## 2021-11-26 NOTE — Anesthesia Procedure Notes (Signed)
Procedure Name: LMA Insertion Date/Time: 11/26/2021 7:53 AM  Performed by: Clearnce Sorrel, CRNAPre-anesthesia Checklist: Patient identified, Emergency Drugs available, Suction available and Patient being monitored Patient Re-evaluated:Patient Re-evaluated prior to induction Oxygen Delivery Method: Circle System Utilized Preoxygenation: Pre-oxygenation with 100% oxygen Induction Type: IV induction Ventilation: Mask ventilation without difficulty LMA: LMA inserted LMA Size: 4.0 Number of attempts: 1 Airway Equipment and Method: Bite block Placement Confirmation: positive ETCO2 Tube secured with: Tape Dental Injury: Teeth and Oropharynx as per pre-operative assessment

## 2021-11-29 ENCOUNTER — Encounter (HOSPITAL_BASED_OUTPATIENT_CLINIC_OR_DEPARTMENT_OTHER): Payer: Self-pay | Admitting: Obstetrics and Gynecology

## 2022-04-06 ENCOUNTER — Ambulatory Visit: Payer: Medicaid Other | Admitting: Dermatology

## 2022-04-21 ENCOUNTER — Ambulatory Visit: Payer: Medicaid Other | Admitting: Dermatology

## 2022-04-21 ENCOUNTER — Encounter: Payer: Self-pay | Admitting: Dermatology

## 2022-04-21 VITALS — BP 128/82 | HR 85

## 2022-04-21 DIAGNOSIS — Z1283 Encounter for screening for malignant neoplasm of skin: Secondary | ICD-10-CM | POA: Diagnosis not present

## 2022-04-21 DIAGNOSIS — B351 Tinea unguium: Secondary | ICD-10-CM | POA: Diagnosis not present

## 2022-04-21 DIAGNOSIS — D225 Melanocytic nevi of trunk: Secondary | ICD-10-CM

## 2022-04-21 DIAGNOSIS — D492 Neoplasm of unspecified behavior of bone, soft tissue, and skin: Secondary | ICD-10-CM | POA: Diagnosis not present

## 2022-04-21 DIAGNOSIS — D239 Other benign neoplasm of skin, unspecified: Secondary | ICD-10-CM

## 2022-04-21 DIAGNOSIS — L820 Inflamed seborrheic keratosis: Secondary | ICD-10-CM

## 2022-04-21 HISTORY — DX: Other benign neoplasm of skin, unspecified: D23.9

## 2022-04-21 NOTE — Progress Notes (Signed)
New Patient Visit  Subjective  Sophia Marks is a 42 y.o. female who presents for the following: Total body skin exam (No hx of skin ca, no fhx of skin ca).  The patient presents for Total-Body Skin Exam (TBSE) for skin cancer screening and mole check.  The patient has spots, moles and lesions to be evaluated, some may be new or changing and the patient has concerns that these could be cancer.   The following portions of the chart were reviewed this encounter and updated as appropriate:   Tobacco  Allergies  Meds  Problems  Med Hx  Surg Hx  Fam Hx      Review of Systems:  No other skin or systemic complaints except as noted in HPI or Assessment and Plan.  Objective  Well appearing patient in no apparent distress; mood and affect are within normal limits.  A full examination was performed including scalp, head, eyes, ears, nose, lips, neck, chest, axillae, abdomen, back, buttocks, bilateral upper extremities, bilateral lower extremities, hands, feet, fingers, toes, fingernails, and toenails. All findings within normal limits unless otherwise noted below.  toenails bil feet Toenail dystrophy  Right Lower Back 5.32mm brown macule with black speckles     back x 16 (16) Stuck on waxy paps with erythema    Assessment & Plan   Lentigines - Scattered tan macules - Due to sun exposure - Benign-appearing, observe - Recommend daily broad spectrum sunscreen SPF 30+ to sun-exposed areas, reapply every 2 hours as needed. - Call for any changes  Seborrheic Keratoses - Stuck-on, waxy, tan-brown papules and/or plaques  - Benign-appearing - Discussed benign etiology and prognosis. - Observe - Call for any changes  Melanocytic Nevi - Tan-brown and/or pink-flesh-colored symmetric macules and papules - Benign appearing on exam today - Observation - Call clinic for new or changing moles - Recommend daily use of broad spectrum spf 30+ sunscreen to sun-exposed areas.    Hemangiomas - Red papules - Discussed benign nature - Observe - Call for any changes  Actinic Damage - Chronic condition, secondary to cumulative UV/sun exposure - diffuse scaly erythematous macules with underlying dyspigmentation - Recommend daily broad spectrum sunscreen SPF 30+ to sun-exposed areas, reapply every 2 hours as needed.  - Staying in the shade or wearing long sleeves, sun glasses (UVA+UVB protection) and wide brim hats (4-inch brim around the entire circumference of the hat) are also recommended for sun protection.  - Call for new or changing lesions.  Skin cancer screening performed today.   Telangiectasia - Dilated blood vessel - Benign appearing on exam - Call for changes   Tinea unguium toenails bil feet  Recommend restarting Penlac as prescribed by podiatrist   Neoplasm of skin Right Lower Back  Epidermal / dermal shaving  Lesion diameter (cm):  0.5 Informed consent: discussed and consent obtained   Timeout: patient name, date of birth, surgical site, and procedure verified   Procedure prep:  Patient was prepped and draped in usual sterile fashion Prep type:  Isopropyl alcohol Anesthesia: the lesion was anesthetized in a standard fashion   Anesthetic:  1% lidocaine w/ epinephrine 1-100,000 buffered w/ 8.4% NaHCO3 Instrument used: DermaBlade   Hemostasis achieved with: pressure and aluminum chloride   Outcome: patient tolerated procedure well   Post-procedure details: sterile dressing applied and wound care instructions given   Dressing type: bacitracin and bandage    Specimen 1 - Surgical pathology Differential Diagnosis: D48.5 R/O Dysplastic Nevus  Check Margins: yes 5.68mm  brown macule with black speckles  Inflamed seborrheic keratosis (16) back x 16  Symptomatic, irritating, patient would like treated.   Destruction of lesion - back x 16 Complexity: simple   Destruction method: cryotherapy   Informed consent: discussed and consent  obtained   Timeout:  patient name, date of birth, surgical site, and procedure verified Lesion destroyed using liquid nitrogen: Yes   Region frozen until ice ball extended beyond lesion: Yes   Outcome: patient tolerated procedure well with no complications   Post-procedure details: wound care instructions given     Return in about 1 year (around 04/21/2023) for TBSE.  I, Othelia Pulling, RMA, am acting as scribe for Ellard Artis, MD .    Documentation: I have reviewed the above documentation for accuracy and completeness, and I agree with the above  [place], DO

## 2022-04-21 NOTE — Patient Instructions (Addendum)
Patient Handout: Wound Care for Skin Biopsy Site  Patient Handout: Wound Care for Skin Biopsy Site  Taking Care of Your Skin Biopsy Site  Proper care of the biopsy site is essential for promoting healing and minimizing scarring. This handout provides instructions on how to care for your biopsy site to ensure optimal recovery.  1. Cleaning the Wound:  Clean the biopsy site daily with gentle soap and water. Gently pat the area dry with a clean, soft towel. Avoid harsh scrubbing or rubbing the area, as this can irritate the skin and delay healing.  2. Applying Aquaphor and Bandage:  After cleaning the wound, apply a thin layer of Aquaphor ointment to the biopsy site. Cover the area with a sterile bandage to protect it from dirt, bacteria, and friction. Change the bandage daily or as needed if it becomes soiled or wet.  3. Continued Care for One Week:  Repeat the cleaning, Aquaphor application, and bandaging process daily for one week following the biopsy procedure. Keeping the wound clean and moist during this initial healing period will help prevent infection and promote optimal healing.  4. Massaging Aquaphor into the Area:  ---After one week, discontinue the use of bandages but continue to apply Aquaphor to the biopsy site. ----Gently massage the Aquaphor into the area using circular motions. ---Massaging the skin helps to promote circulation and prevent the formation of scar tissue.   Additional Tips:  Avoid exposing the biopsy site to direct sunlight during the healing process, as this can cause hyperpigmentation or worsen scarring. If you experience any signs of infection, such as increased redness, swelling, warmth, or drainage from the wound, contact your healthcare provider immediately. Follow any additional instructions provided by your healthcare provider for caring for the biopsy site and managing any discomfort. Conclusion:  Taking proper care of your skin biopsy site  is crucial for ensuring optimal healing and minimizing scarring. By following these instructions for cleaning, applying Aquaphor, and massaging the area, you can promote a smooth and successful recovery. If you have any questions or concerns about caring for your biopsy site, don't hesitate to contact your healthcare provider for guidance.     Cryotherapy Aftercare  Wash gently with soap and water everyday.   Apply Vaseline and Band-Aid daily until healed.   Due to recent changes in healthcare laws, you may see results of your pathology and/or laboratory studies on MyChart before the doctors have had a chance to review them. We understand that in some cases there may be results that are confusing or concerning to you. Please understand that not all results are received at the same time and often the doctors may need to interpret multiple results in order to provide you with the best plan of care or course of treatment. Therefore, we ask that you please give [place] 2 business days to thoroughly review all your results before contacting the office for clarification. Should we see a critical lab result, you will be contacted sooner.   If You Need Anything After Your Visit  If you have any questions or concerns for your doctor, please call our main line at [phone] If no one answers, please leave a voicemail as directed and we will return your call as soon as possible. Messages left after 4 pm will be answered the following business day.   You may also send [place] a message via [place]. We typically respond to MyChart messages within 1-2 business days.  For prescription refills, please ask your pharmacy  to contact our office. Our fax number is [phone].  If you have an urgent issue when the clinic is closed that cannot wait until the next business day, you can page your doctor at the number below.    Please note that while we do our best to be available for urgent issues outside of office hours, we  are not available 24/7.   If you have an urgent issue and are unable to reach [place], you may choose to seek medical care at your doctor's office, retail clinic, urgent care center, or emergency room.  If you have a medical emergency, please immediately call 911 or go to the emergency department. In the event of inclement weather, please call our main line at [phone] for an update on the status of any delays or closures.  Dermatology Medication Tips: Please keep the boxes that topical medications come in in order to help keep track of the instructions about where and how to use these. Pharmacies typically print the medication instructions only on the boxes and not directly on the medication tubes.   If your medication is too expensive, please contact our office at [phone] or send [place] a message through [place].   We are unable to tell what your co-pay for medications will be in advance as this is different depending on your insurance coverage. However, we may be able to find a substitute medication at lower cost or fill out paperwork to get insurance to cover a needed medication.   If a prior authorization is required to get your medication covered by your insurance company, please allow [place] 1-2 business days to complete this process.  Drug prices often vary depending on where the prescription is filled and some pharmacies may offer cheaper prices.  The website www.goodrx.com contains coupons for medications through different pharmacies. The prices here do not account for what the cost may be with help from insurance (it may be cheaper with your insurance), but the website can give you the price if you did not use any insurance.  - You can print the associated coupon and take it with your prescription to the pharmacy.  - You may also stop by our office during regular business hours and pick up a GoodRx coupon card.  - If you need your prescription sent electronically to a different pharmacy,  notify our office through [facility] or by phone at [phone]

## 2022-04-24 ENCOUNTER — Encounter: Payer: Self-pay | Admitting: Dermatology

## 2022-04-25 ENCOUNTER — Telehealth: Payer: Self-pay

## 2022-04-25 ENCOUNTER — Encounter: Payer: Self-pay | Admitting: Podiatry

## 2022-04-25 NOTE — Telephone Encounter (Signed)
Left message for patient to call office for pathology results/hd 

## 2022-04-25 NOTE — Telephone Encounter (Signed)
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

## 2022-04-26 NOTE — Telephone Encounter (Signed)
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

## 2022-05-13 NOTE — Progress Notes (Signed)
Hi Sophia Marks  Dr. Onalee Hua reviewed your biopsy results and they showed the spot removed was benign not cancerous.  No additional treatment is required.  The detailed report is available to view in MyChart.  We look forward to seeing you at your yearly skin checks. Have a great day!  Kind Regards,  Dr. Kermit Balo Care Team

## 2022-05-17 ENCOUNTER — Encounter: Payer: Self-pay | Admitting: Dermatology

## 2022-09-23 ENCOUNTER — Telehealth: Payer: Self-pay | Admitting: Neurology

## 2022-09-23 NOTE — Telephone Encounter (Signed)
Patient called after hours nurse line, patient states she is experiencing new symptoms numbness and tingling all over body

## 2022-09-23 NOTE — Telephone Encounter (Signed)
Patient advised, Unfortunately, I have no further recommendations as I have already evaluated and worked her up for these symptoms and have not found a neurologic explanation.  I recommend that she speak with her PCP.  If her PCP still suspects a neurologic etiology, I recommend that they refer to another neurologist.

## 2022-11-07 ENCOUNTER — Other Ambulatory Visit: Payer: Self-pay | Admitting: Obstetrics and Gynecology

## 2022-11-07 DIAGNOSIS — N6320 Unspecified lump in the left breast, unspecified quadrant: Secondary | ICD-10-CM

## 2022-11-22 ENCOUNTER — Ambulatory Visit
Admission: RE | Admit: 2022-11-22 | Discharge: 2022-11-22 | Disposition: A | Discharge status: Home / Self Care | Payer: Medicaid Other | Source: Ambulatory Visit | Attending: Obstetrics and Gynecology | Admitting: Obstetrics and Gynecology

## 2022-11-22 ENCOUNTER — Ambulatory Visit
Admission: RE | Admit: 2022-11-22 | Discharge: 2022-11-22 | Disposition: A | Discharge status: Home / Self Care | Payer: Medicaid Other | Source: Ambulatory Visit | Attending: Obstetrics and Gynecology

## 2022-11-22 ENCOUNTER — Other Ambulatory Visit: Payer: Self-pay | Admitting: Obstetrics and Gynecology

## 2022-11-22 DIAGNOSIS — N6320 Unspecified lump in the left breast, unspecified quadrant: Secondary | ICD-10-CM

## 2022-11-24 ENCOUNTER — Ambulatory Visit
Admission: RE | Admit: 2022-11-24 | Discharge: 2022-11-24 | Disposition: A | Discharge status: Home / Self Care | Payer: Medicaid Other | Source: Ambulatory Visit | Attending: Obstetrics and Gynecology | Admitting: Obstetrics and Gynecology

## 2022-11-24 DIAGNOSIS — N6320 Unspecified lump in the left breast, unspecified quadrant: Secondary | ICD-10-CM

## 2022-11-24 HISTORY — PX: BREAST BIOPSY: SHX20

## 2022-11-25 LAB — SURGICAL PATHOLOGY

## 2023-01-09 ENCOUNTER — Encounter: Payer: Self-pay | Admitting: Dermatology

## 2023-01-12 NOTE — Telephone Encounter (Signed)
Please let patient know that we can definitely discuss this at her appointment in March.  Thanks!

## 2023-02-13 ENCOUNTER — Encounter: Payer: Self-pay | Admitting: "Endocrinology

## 2023-02-15 ENCOUNTER — Encounter: Payer: Self-pay | Admitting: Dermatology

## 2023-02-28 ENCOUNTER — Ambulatory Visit: Payer: Medicaid Other | Admitting: "Endocrinology

## 2023-03-24 ENCOUNTER — Other Ambulatory Visit: Payer: Self-pay | Admitting: Obstetrics and Gynecology

## 2023-03-24 DIAGNOSIS — N63 Unspecified lump in unspecified breast: Secondary | ICD-10-CM

## 2023-04-10 ENCOUNTER — Other Ambulatory Visit: Payer: Self-pay | Admitting: Family Medicine

## 2023-04-10 DIAGNOSIS — E038 Other specified hypothyroidism: Secondary | ICD-10-CM

## 2023-04-10 DIAGNOSIS — R1310 Dysphagia, unspecified: Secondary | ICD-10-CM

## 2023-04-12 ENCOUNTER — Ambulatory Visit
Admission: RE | Admit: 2023-04-12 | Discharge: 2023-04-12 | Disposition: A | Discharge status: Home / Self Care | Payer: Medicaid Other | Source: Ambulatory Visit | Attending: Obstetrics and Gynecology

## 2023-04-12 ENCOUNTER — Ambulatory Visit: Payer: Medicaid Other

## 2023-04-12 DIAGNOSIS — N63 Unspecified lump in unspecified breast: Secondary | ICD-10-CM

## 2023-04-13 ENCOUNTER — Ambulatory Visit
Admission: RE | Admit: 2023-04-13 | Discharge: 2023-04-13 | Disposition: A | Discharge status: Home / Self Care | Source: Ambulatory Visit | Attending: Family Medicine | Admitting: Family Medicine

## 2023-04-13 DIAGNOSIS — R1310 Dysphagia, unspecified: Secondary | ICD-10-CM

## 2023-04-13 DIAGNOSIS — E038 Other specified hypothyroidism: Secondary | ICD-10-CM

## 2023-04-18 ENCOUNTER — Encounter: Payer: Self-pay | Admitting: "Endocrinology

## 2023-04-18 ENCOUNTER — Ambulatory Visit: Payer: Medicaid Other | Admitting: "Endocrinology

## 2023-04-18 VITALS — BP 106/80 | HR 104 | Ht 62.0 in | Wt 169.0 lb

## 2023-04-18 DIAGNOSIS — Z6830 Body mass index (BMI) 30.0-30.9, adult: Secondary | ICD-10-CM | POA: Diagnosis not present

## 2023-04-18 DIAGNOSIS — E66811 Obesity, class 1: Secondary | ICD-10-CM

## 2023-04-18 DIAGNOSIS — E6609 Other obesity due to excess calories: Secondary | ICD-10-CM

## 2023-04-18 DIAGNOSIS — E039 Hypothyroidism, unspecified: Secondary | ICD-10-CM | POA: Diagnosis not present

## 2023-04-18 NOTE — Progress Notes (Signed)
 Outpatient Endocrinology Note Altamese D[place]nedale [place], MD  04/18/23   Sophia Marks 02-21-1980 [id number]  Referring Provider: Sigmund Hazel, MD Primary Care Provider: Jarrett Soho, PA-C Subjective  No chief complaint on file.   Assessment & Plan  Diagnoses and all orders for this visit:  Acquired hypothyroidism  Class 1 obesity due to excess calories with body mass index (BMI) of 30.0 to 30.9 in adult, unspecified whether serious comorbidity present    Sophia Marks is currently taking levothyroxine 75 mcg with other medication.  Has history of cerebral palsy and hydrocephalus.  Patient is currently biochemically euthyroid.  Educated on thyroid axis.  Recommend the following: Take levothyroxine 75 mcg po every day (except once a week takes 1.5 pills). Advised to take levothyroxine first thing in the morning on empty stomach and wait at least 30 minutes to 1 hour before eating or drinking anything or taking any other medications. Space out levothyroxine by 4 hours from any acid reflux medication/fibrate/iron/calcium/multivitamin. Advised to take birth control pills and nutritional supplements in the evening. Repeat lab before next visit or sooner if symptoms of hyperthyroidism or hypothyroidism develop.  Notify us immediately in case of pregnancy/breastfeeding or significant weight gain or loss. Counseled on compliance and follow up needs.  04/13/23 thyroid ultrasound reported normal thyroid gland with no nodules   Patient concerned about her weight No recent baseline A1C/lipids in mychart, patient does labs with [facility]  Patient will return to discuss it in more details and will find out from insurance if weight loss medications are covered     I have reviewed current medications, nurse's notes, allergies, vital signs, past medical and surgical history, family medical history, and social history for this encounter. Counseled patient on symptoms, examination findings, lab  findings, imaging results, treatment decisions and monitoring and prognosis. The patient understood the recommendations and agrees with the treatment plan. All questions regarding treatment plan were fully answered.   Return in about 4 weeks (around 05/16/2023).   Altamese [place]ide, MD  04/18/23   I have reviewed current medications, nurse's notes, allergies, vital signs, past medical and surgical history, family medical history, and social history for this encounter. Counseled patient on symptoms, examination findings, lab findings, imaging results, treatment decisions and monitoring and prognosis. The patient understood the recommendations and agrees with the treatment plan. All questions regarding treatment plan were fully answered.   History of Present Illness Sophia Marks is a 42 y.o. year old female who presents to our clinic with hypothyroidism diagnosed years ago.  On levothyroxine 75 mcg po every day (except once a week takes 1.5 pills), takes other medications at the same time   Symptoms suggestive of HYPOTHYROIDISM:  fatigue Yes weight gain Yes cold intolerance  Yes constipation  No  Symptoms suggestive of HYPERTHYROIDISM:  weight loss  No heat intolerance No hyperdefecation  Yes palpitations  No  Compressive symptoms:  dysphagia  Yes dysphonia  No positional dyspnea (especially with simultaneous arms elevation)  No  Smokes  No On biotin  No Personal history of head/neck surgery/irradiation  Yes, haas VP shunt   04/13/23 THYROID ULTRASOUND   TECHNIQUE: Ultrasound examination of the thyroid gland and adjacent soft tissues was performed.   COMPARISON:  None Available.   FINDINGS: Parenchymal Echotexture: Normal   Isthmus: 0.5 cm   Right lobe: 3.5 x 1.6 x 1.4 cm   Left lobe: 3.5 x 1.5 x 1.1 cm   _________________________________________________________   Estimated total number of nodules >/=  1 cm: 0   Number of spongiform nodules >/=  2 cm not described  below (TR1): 0   Number of mixed cystic and solid nodules >/= 1.5 cm not described below (TR2): 0   _________________________________________________________   No discrete nodules of consequence are seen within the thyroid gland.   IMPRESSION: Normal sonographic appearance of the thyroid gland.  04/04/23 TSH 3.6 FT4 0.7 FT3 3.06 TPO 24 (WNL) Tg Ab <1 (WNL)  Physical Exam  BP 106/80   Pulse (!) 104   Ht 5\' 2"  (1.575 m)   Wt 169 lb (76.7 kg)   SpO2 95%   BMI 30.91 kg/m  Constitutional: well developed, well nourished Head: normocephalic, atraumatic, no exophthalmos Eyes: sclera anicteric, no redness Neck: no thyromegaly, no thyroid tenderness; no nodules palpated Lungs: normal respiratory effort Neurology: alert and oriented, no fine hand tremor Skin: dry, no appreciable rashes Musculoskeletal: no appreciable defects Psychiatric: normal mood and affect  Allergies Allergies  Allergen Reactions   Tape     Opsite, rash and itching    Current Medications Patient's Medications  New Prescriptions   No medications on file  Previous Medications   ACETAMINOPHEN (TYLENOL) 500 MG TABLET    Take 1,000 mg by mouth every 6 (six) hours as needed for moderate pain or headache.   CITALOPRAM (CELEXA) 20 MG TABLET    Take 10 mg by mouth daily.    HYDROXYZINE (ATARAX/VISTARIL) 25 MG TABLET    Take 25 mg by mouth every 8 (eight) hours as needed for anxiety.    IBUPROFEN (ADVIL) 200 MG TABLET    Take 3 tablets (600 mg total) by mouth every 6 (six) hours as needed for headache or moderate pain.   LEVOTHYROXINE (SYNTHROID, LEVOTHROID) 50 MCG TABLET    Take 50 mcg by mouth daily before breakfast.    LISINOPRIL (ZESTRIL) 5 MG TABLET    Take 5 mg by mouth daily.   ONDANSETRON (ZOFRAN) 4 MG TABLET    Take 1 tablet (4 mg total) by mouth every 8 (eight) hours as needed for nausea or vomiting.   OXYCODONE (ROXICODONE) 5 MG IMMEDIATE RELEASE TABLET    Take 1 tablet (5 mg total) by mouth every 4  (four) hours as needed for severe pain.   VALACYCLOVIR (VALTREX) 1000 MG TABLET    Take 1,000 mg by mouth 2 (two) times daily as needed (cold sores).  Modified Medications   No medications on file  Discontinued Medications   CICLOPIROX (PENLAC) 8 % SOLUTION    APPLY TOPICALLY AT BEDTIME. APPLY OVER NAIL AND SURROUNDING SKIN. APPLY DAILY OVER PREVIOUS COAT. AFTER 7 DAYS, MAY REMOVE WITH ALCOHOL AND CONTINUE CYCLE.   EFINACONAZOLE 10 % SOLN    Apply 1 drop topically daily.    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   Symptomatic mammary hypertrophy 04/05/2019   VP (ventriculoperitoneal) shunt status     Past Surgical History Past Surgical History:  Procedure  Laterality Date   BRAIN SURGERY     BREAST BIOPSY Left 11/24/2022   [place] LT BREAST BX W LOC DEV 1ST LESION IMG BX SPEC US GUIDE 11/24/2022 [facility]   BREAST REDUCTION SURGERY Bilateral 01/01/2020   Procedure: MAMMARY REDUCTION  (BREAST);  Surgeon: Peggye Form, DO;  Location: [facility];  Service: Plastics;  Laterality: Bilateral;  3 hours   DILITATION & CURRETTAGE/HYSTROSCOPY WITH NOVASURE ABLATION N/A 11/26/2021   Procedure: DILATATION & CURETTAGE/HYSTEROSCOPY WITH NOVASURE ABLATION;  Surgeon: Huel Cote, MD;  Location: [facility];  Service: Gynecology;  Laterality: N/A;   heal surgery     REDUCTION MAMMAPLASTY     VENTRICULOPERITONEAL SHUNT  11/16/2011   Procedure: SHUNT  INSERTION VENTRICULAR-PERITONEAL;  Surgeon: Maeola Harman, MD;  Location: [facility];  Service: Neurosurgery;  Laterality: Right;  Ventricular Peritoneal Shunt Insertion/Revision with Laparoscopic Abdominal Approach    Family History family history includes Breast cancer in her paternal grandmother; CVA in her paternal grandmother; Depression in her maternal grandfather; Hypertension in her mother.  Social History Social History   Socioeconomic History   Marital status: Single    Spouse name: Not on file   Number of children: Not on file   Years of education: 16   Highest education level: Bachelor's degree (e.g., BA, AB, BS)  Occupational History   Not on file  Tobacco Use   Smoking status: Never   Smokeless tobacco: Never  Vaping Use   Vaping status: Never Used  Substance and Sexual Activity   Alcohol use: Yes    Alcohol/week: 0.0 standard drinks of alcohol    Comment: occasionally   Drug use: No   Sexual activity: Not Currently  Other Topics Concern   Not on file  Social History Narrative   Right handed   One story home    Drinks caffeine   Works at home on computer.   Social Drivers of Corporate investment banker Strain: Not on file  Food Insecurity: Not on file  Transportation Needs: Not on file  Physical Activity: Not on file  Stress: Not on file  Social Connections: Not on file  Intimate Partner Violence: Not on file    Laboratory Investigations Lab Results  Component Value Date   TSH 2.50 04/02/2018   FREET4 0.78 04/02/2018     No results found for: "TSI"   No components found for: "TRAB"   No results found for: "CHOL" No results found for: "HDL" No results found for: "LDLCALC" No results found for: "TRIG" No results found for: "CHOLHDL" Lab Results  Component Value Date   CREATININE 0.60 11/26/2021   No results found for: "GFR"    Component Value Date/Time   NA 139 11/26/2021 0737   K 3.7 11/26/2021 0737   CL 109 11/26/2021 0737   CO2 23  11/26/2021 0655   GLUCOSE 80 11/26/2021 0737   BUN 16 11/26/2021 0737   CREATININE 0.60 11/26/2021 0737   CALCIUM 8.9 11/26/2021 0655   PROT 7.3 11/26/2021 0655   PROT 7.3 05/17/2021 1638   ALBUMIN 4.0 11/26/2021 0655   ALBUMIN 4.6 05/17/2021 1638   AST 19 11/26/2021 0655   ALT 20 11/26/2021 0655   ALKPHOS 86 11/26/2021 0655   BILITOT 0.6 11/26/2021 0655   BILITOT 0.3 05/17/2021 1638   GFRNONAA >60 11/26/2021 0655   GFRAA >90 11/14/2011 1721      Latest Ref Rng & Units 11/26/2021    7:37 AM 11/26/2021    6:55  AM 11/14/2011    5:21 PM  BMP  Glucose 70 - 99 mg/dL 80  86  213   BUN 6 - 20 mg/dL 16  18  13    Creatinine 0.44 - 1.00 mg/dL 0.86  5.78  4.69   Sodium 135 - 145 mmol/L 139  139  137   Potassium 3.5 - 5.1 mmol/L 3.7  3.7  3.9   Chloride 98 - 111 mmol/L 109  108  101   CO2 22 - 32 mmol/L  23  24   Calcium 8.9 - 10.3 mg/dL  8.9  9.4        Component Value Date/Time   WBC 9.8 11/26/2021 0655   RBC 4.42 11/26/2021 0655   HGB 13.9 11/26/2021 0737   HGB 14.2 05/17/2021 1638   HCT 41.0 11/26/2021 0737   HCT 41.4 05/17/2021 1638   PLT 272 11/26/2021 0655   PLT 348 05/17/2021 1638   MCV 91.4 11/26/2021 0655   MCV 87 05/17/2021 1638   MCH 30.3 11/26/2021 0655   MCHC 33.2 11/26/2021 0655   RDW 12.0 11/26/2021 0655   RDW 11.7 05/17/2021 1638   LYMPHSABS 3.0 05/17/2021 1638   MONOABS 0.3 11/14/2011 1721   EOSABS 0.2 05/17/2021 1638   BASOSABS 0.0 05/17/2021 1638      Parts of this note may have been dictated using voice recognition software. There may be variances in spelling and vocabulary which are unintentional. Not all errors are proofread. Please notify the [facility] Parkin if any discrepancies are noted or if the meaning of any statement is not clear.

## 2023-04-20 ENCOUNTER — Encounter: Payer: Self-pay | Admitting: Dermatology

## 2023-04-20 ENCOUNTER — Ambulatory Visit: Payer: Medicaid Other | Admitting: Dermatology

## 2023-04-20 DIAGNOSIS — Z1283 Encounter for screening for malignant neoplasm of skin: Secondary | ICD-10-CM | POA: Diagnosis not present

## 2023-04-20 DIAGNOSIS — D492 Neoplasm of unspecified behavior of bone, soft tissue, and skin: Secondary | ICD-10-CM

## 2023-04-20 DIAGNOSIS — D229 Melanocytic nevi, unspecified: Secondary | ICD-10-CM

## 2023-04-20 DIAGNOSIS — L578 Other skin changes due to chronic exposure to nonionizing radiation: Secondary | ICD-10-CM

## 2023-04-20 DIAGNOSIS — L820 Inflamed seborrheic keratosis: Secondary | ICD-10-CM | POA: Diagnosis not present

## 2023-04-20 DIAGNOSIS — L814 Other melanin hyperpigmentation: Secondary | ICD-10-CM

## 2023-04-20 DIAGNOSIS — D1801 Hemangioma of skin and subcutaneous tissue: Secondary | ICD-10-CM | POA: Diagnosis not present

## 2023-04-20 DIAGNOSIS — W908XXA Exposure to other nonionizing radiation, initial encounter: Secondary | ICD-10-CM

## 2023-04-20 DIAGNOSIS — Z840 Family history of diseases of the skin and subcutaneous tissue: Secondary | ICD-10-CM

## 2023-04-20 DIAGNOSIS — D485 Neoplasm of uncertain behavior of skin: Secondary | ICD-10-CM

## 2023-04-20 DIAGNOSIS — L719 Rosacea, unspecified: Secondary | ICD-10-CM

## 2023-04-20 DIAGNOSIS — L821 Other seborrheic keratosis: Secondary | ICD-10-CM

## 2023-04-20 NOTE — Progress Notes (Signed)
 Total Body Skin Exam (TBSE) Visit   Subjective  Sophia Marks is a 43 y.o. female who presents for the following: Skin Cancer Screening and UBSE Exam  Patient presents today for follow up visit for UBSE. Patient was last evaluated on 04/21/22 . Patient denies medication changes. Patient reports she does not have spots, moles and lesions of concern to be evaluated. Patient reports throughout her lifetime she has had minimal sun exposure. Currently, patient reports if she has excessive sun exposure, she does not apply sunscreen and/or wears protective coverings. Patient reports she has hx of bx. Patient denies  family history of skin cancers. The patient has spots, moles and lesions to be evaluated, some may be new or changing and the patient has concerns that these could be cancer.  The following portions of the chart were reviewed this encounter and updated as appropriate: medications, allergies, medical history  Review of Systems:  No other skin or systemic complaints except as noted in HPI or Assessment and Plan.  Objective  Well appearing patient in no apparent distress; mood and affect are within normal limits.  A full examination was performed including scalp, head, eyes, ears, nose, lips, neck, chest, axillae, abdomen, back, bilateral upper extremities, hands, fingers & fingernails. All findings within normal limits unless otherwise noted below.   Relevant physical exam findings are noted in the Assessment and Plan.       Right Upper Back 6 mm pink papule  Assessment & Plan   LENTIGINES, SEBORRHEIC KERATOSES, Cherry ANGIOMAS - Benign normal skin lesions - Benign-appearing - Call for any changes  MELANOCYTIC NEVI - Tan-brown and/or pink-flesh-colored symmetric macules and papules - Benign appearing on exam today - Observation - Call clinic for new or changing moles - Recommend daily use of broad spectrum spf 30+ sunscreen to sun-exposed areas.   ACTINIC DAMAGE - Chronic  condition, secondary to cumulative UV/sun exposure - diffuse scaly erythematous macules with underlying dyspigmentation - Recommend daily broad spectrum sunscreen SPF 30+ to sun-exposed areas, reapply every 2 hours as needed.  - Staying in the shade or wearing long sleeves, sun glasses (UVA+UVB protection) and wide brim hats (4-inch brim around the entire circumference of the hat) are also recommended for sun protection.  - Call for new or changing lesions.  ROSACEA Exam:Mid face erythema with telangiectasias +/- scattered inflammatory papules  Flared  Rosacea is a chronic progressive skin condition usually affecting the face of adults, causing redness and/or acne bumps. It is treatable but not curable. It sometimes affects the eyes (ocular rosacea) as well. It may respond to topical and/or systemic medication and can flare with stress, sun exposure, alcohol, exercise, topical steroids (including hydrocortisone/cortisone 10) and some foods.  Daily application of broad spectrum spf 30+ sunscreen to face is recommended to reduce flares.  Patient denies grittiness of the eyes  SKIN CANCER SCREENING PERFORMED TODAY.  FAMILY HISTORY OF CUTANEOUS LUPUS ERYTHEMATOSUS   NEOPLASM OF UNCERTAIN BEHAVIOR OF SKIN Right Upper Back Epidermal / dermal shaving  Lesion diameter (cm):  0.6 Informed consent: discussed and consent obtained   Timeout: patient name, date of birth, surgical site, and procedure verified   Procedure prep:  Patient was prepped and draped in usual sterile fashion Prep type:  Isopropyl alcohol Anesthesia: the lesion was anesthetized in a standard fashion   Anesthetic:  1% lidocaine w/ epinephrine 1-100,000 buffered w/ 8.4% NaHCO3 Instrument used: DermaBlade   Hemostasis achieved with: aluminum chloride   Outcome: patient tolerated procedure well  Post-procedure details: sterile dressing applied and wound care instructions given   Dressing type: petrolatum   Additional  details:  Pt aware that benign results will be sent to mychart and the staff will call abnormal results will Specimen 1 - Surgical pathology Differential Diagnosis: R/O DN  Check Margins: No Return in about 1 year (around 04/19/2024) for TBSE.  Documentation: I have reviewed the above documentation for accuracy and completeness, and I agree with the above.  Stasia Cavalier, am acting as scribe for Langston Reusing, DO.  Langston Reusing, DO

## 2023-04-20 NOTE — Patient Instructions (Addendum)
 Hello Sophia Marks,  Thank you for visiting today. Here is a summary of the key instructions:  - Skin Care:   - Use Redness Relief Sunscreen by Michaelle Birks daily   - Apply moisturizer with sunscreen every day  - Tests:   - Get ANA blood test at your convenience to rule out a possible Lupus diagnosis  - Follow-up:   - Annual skin check in one year   - Check MyChart for biopsy results and ANA test results  - Procedures:   - Biopsy of 6mm pink papule on right upper back  We look forward to seeing you at your next visit. If you have any questions or concerns before then, please do not hesitate to contact our office.  Warm regards,  Dr. Langston Reusing, Dermatology   Patient Handout: Wound Care for Skin Biopsy Site  Taking Care of Your Skin Biopsy Site  Proper care of the biopsy site is essential for promoting healing and minimizing scarring. This handout provides instructions on how to care for your biopsy site to ensure optimal recovery.  1. Cleaning the Wound:  Clean the biopsy site daily with gentle soap and water. Gently pat the area dry with a clean, soft towel. Avoid harsh scrubbing or rubbing the area, as this can irritate the skin and delay healing.  2. Applying Aquaphor and Bandage:  After cleaning the wound, apply a thin layer of Aquaphor ointment to the biopsy site. Cover the area with a sterile bandage to protect it from dirt, bacteria, and friction. Change the bandage daily or as needed if it becomes soiled or wet.  3. Continued Care for One Week:  Repeat the cleaning, Aquaphor application, and bandaging process daily for one week following the biopsy procedure. Keeping the wound clean and moist during this initial healing period will help prevent infection and promote optimal healing.  4. Massaging Aquaphor into the Area:  ---After one week, discontinue the use of bandages but continue to apply Aquaphor to the biopsy site. ----Gently massage the Aquaphor into  the area using circular motions. ---Massaging the skin helps to promote circulation and prevent the formation of scar tissue.   Additional Tips:  Avoid exposing the biopsy site to direct sunlight during the healing process, as this can cause hyperpigmentation or worsen scarring. If you experience any signs of infection, such as increased redness, swelling, warmth, or drainage from the wound, contact your healthcare provider immediately. Follow any additional instructions provided by your healthcare provider for caring for the biopsy site and managing any discomfort. Conclusion:  Taking proper care of your skin biopsy site is crucial for ensuring optimal healing and minimizing scarring. By following these instructions for cleaning, applying Aquaphor, and massaging the area, you can promote a smooth and successful recovery. If you have any questions or concerns about caring for your biopsy site, don't hesitate to contact your healthcare provider for guidance.    Important Information   Due to recent changes in healthcare laws, you may see results of your pathology and/or laboratory studies on MyChart before the doctors have had a chance to review them. We understand that in some cases there may be results that are confusing or concerning to you. Please understand that not all results are received at the same time and often the doctors may need to interpret multiple results in order to provide you with the best plan of care or course of treatment. Therefore, we ask that you please give [place] 2 business days to thoroughly  review all your results before contacting the office for clarification. Should we see a critical lab result, you will be contacted sooner.     If You Need Anything After Your Visit   If you have any questions or concerns for your doctor, please call our main line at [phone]. If no one answers, please leave a voicemail as directed and we will return your call as soon as possible.  Messages left after 4 pm will be answered the following business day.    You may also send [place] a message via MyChart. We typically respond to MyChart messages within 1-2 business days.  For prescription refills, please ask your pharmacy to contact our office. Our fax number is [phone].  If you have an urgent issue when the clinic is closed that cannot wait until the next business day, you can page your doctor at the number below.     Please note that while we do our best to be available for urgent issues outside of office hours, we are not available 24/7.    If you have an urgent issue and are unable to reach [place], you may choose to seek medical care at your doctor's office, retail clinic, urgent care center, or emergency room.   If you have a medical emergency, please immediately call 911 or go to the emergency department. In the event of inclement weather, please call our main line at [phone] for an update on the status of any delays or closures.  Dermatology Medication Tips: Please keep the boxes that topical medications come in in order to help keep track of the instructions about where and how to use these. Pharmacies typically print the medication instructions only on the boxes and not directly on the medication tubes.   If your medication is too expensive, please contact our office at [phone] or send [place] a message through MyChart.    We are unable to tell what your co-pay for medications will be in advance as this is different depending on your insurance coverage. However, we may be able to find a substitute medication at lower cost or fill out paperwork to get insurance to cover a needed medication.    If a prior authorization is required to get your medication covered by your insurance company, please allow [place] 1-2 business days to complete this process.   Drug prices often vary depending on where the prescription is filled and some pharmacies may offer cheaper prices.    The website www.goodrx.com contains coupons for medications through different pharmacies. The prices here do not account for what the cost may be with help from insurance (it may be cheaper with your insurance), but the website can give you the price if you did not use any insurance.  - You can print the associated coupon and take it with your prescription to the pharmacy.  - You may also stop by our office during regular business hours and pick up a GoodRx coupon card.  - If you need your prescription sent electronically to a different pharmacy, notify our office through [facility] or by phone at [phone]

## 2023-04-21 LAB — SURGICAL PATHOLOGY

## 2023-04-24 ENCOUNTER — Encounter: Payer: Self-pay | Admitting: Dermatology

## 2023-05-29 ENCOUNTER — Ambulatory Visit: Admitting: "Endocrinology

## 2023-10-06 IMAGING — MR MR HEAD WO/W CM
13 series · 48 of 48 positions shown · IV contrast (gadavist)
Comparison: MR head 11/30/2017

CLINICAL DATA: Facial numbness/tingling and ventriculoperitoneal
shunt.

EXAM:
MRI HEAD WITHOUT AND WITH CONTRAST
TECHNIQUE: Multiplanar, multiecho pulse sequences of the brain and surrounding
structures were obtained without and with intravenous contrast.
CONTRAST:  7mL GADAVIST GADOBUTROL 1 MMOL/ML IV SOLN

[Series 5: DWI · axial · 3.0mm · 1.36mm/px · z∈[-92,+71]mm · 6 of 111 slices shown (1 of 2)]
[im 1/111]
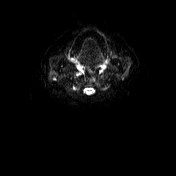
[im 23/111]
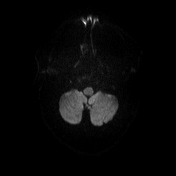
[im 45/111]
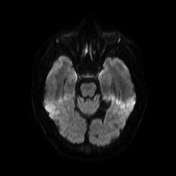
[im 67/111]
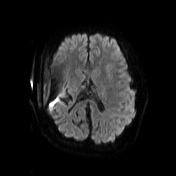
[im 89/111]
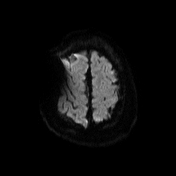
[im 111/111]
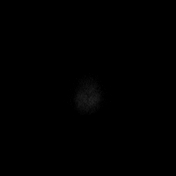

[Series 6: DWI · axial · 3.0mm · 1.36mm/px · z∈[-92,+71]mm · 3 of 56 slices shown (2 of 2)]
[im 1/56]
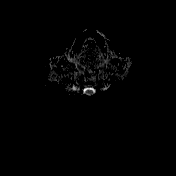
[im 28/56]
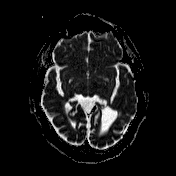
[im 56/56]
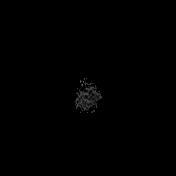

[Series 7: T1 · sagittal · 5.0mm · 0.75mm/px · 2 of 26 slices shown (1 of 2)]
[im 1/26]
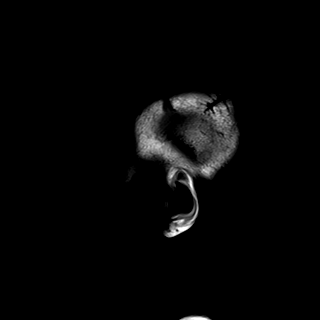
[im 26/26]
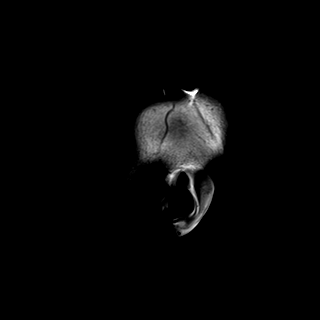

[Series 8: T2 · axial · 5.0mm · 0.62mm/px · z∈[-100,+59]mm · 2 of 26 slices shown]
[im 1/26]
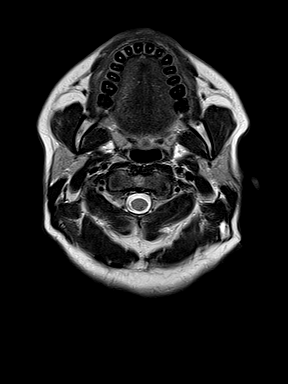
[im 26/26]
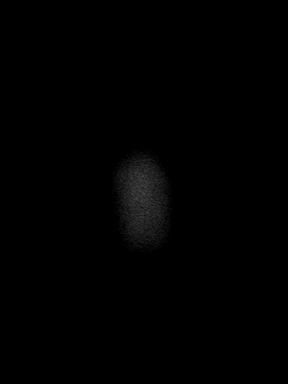

[Series 9: swi_images · axial · 3.0mm · 0.75mm/px · z∈[-96,+54]mm · 3 of 52 slices shown]
[im 1/52]
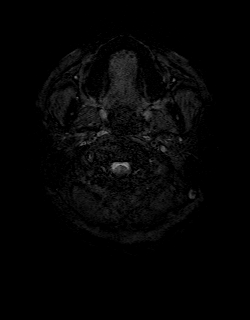
[im 26/52]
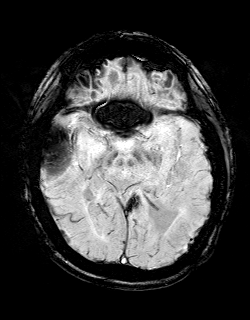
[im 52/52]
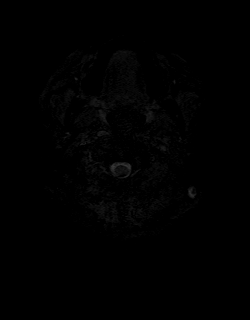

[Series 11: FLAIR · axial · 3.0mm · 0.75mm/px · z∈[-96,+54]mm · 3 of 52 slices shown]
[im 1/52]
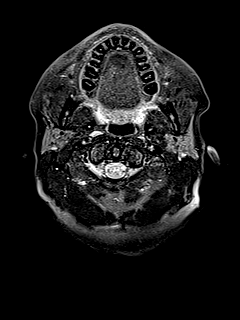
[im 26/52]
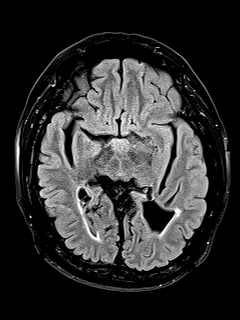
[im 52/52]
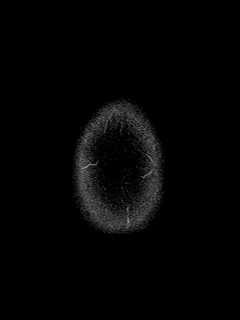

[Series 12: T1 · axial · 1.0mm · 0.94mm/px · z∈[-99,+56]mm · 9 of 160 slices shown (2 of 2)]
[im 1/160]
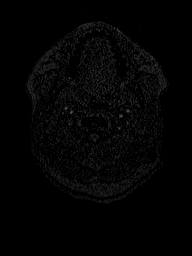
[im 20/160]
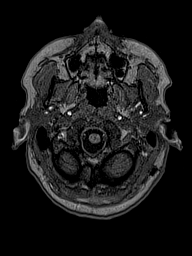
[im 40/160]
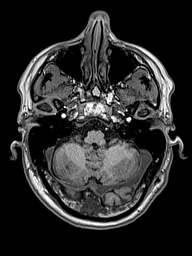
[im 60/160]
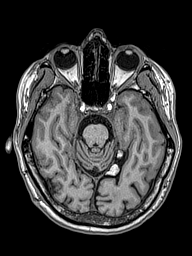
[im 80/160]
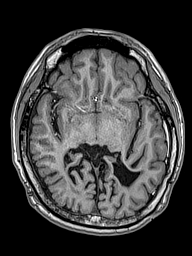
[im 100/160]
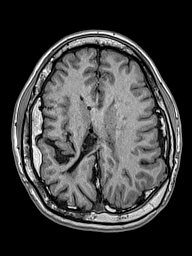
[im 120/160]
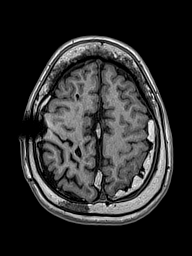
[im 140/160]
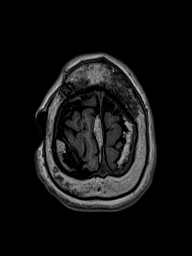
[im 160/160]
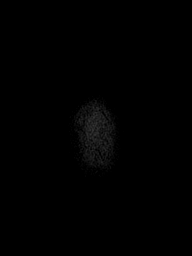

[Series 13: cor dwi_tracew · coronal · 5.0mm · 1.53mm/px · 3 of 56 slices shown]
[im 1/56]
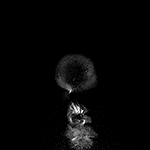
[im 28/56]
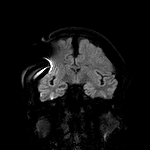
[im 56/56]
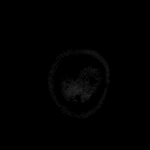

[Series 14: cor dwi_adc · coronal · 5.0mm · 1.53mm/px · 2 of 28 slices shown]
[im 1/28]
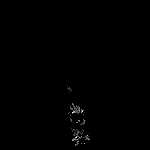
[im 28/28]
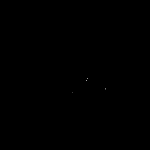

[Series 15: T2 post-contrast · coronal · 5.0mm · 0.57mm/px · 2 of 30 slices shown]
[im 1/30]
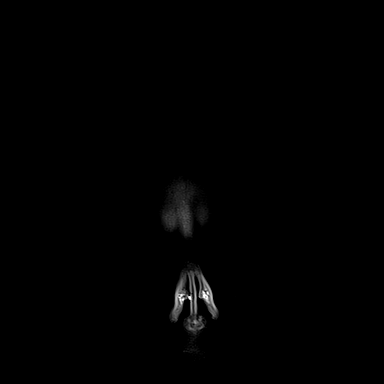
[im 30/30]
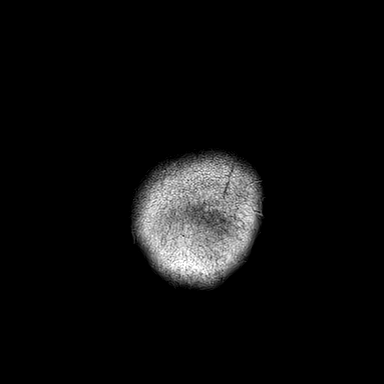

[Series 16: T1 post-contrast · axial · 1.0mm · 0.94mm/px · z∈[-99,+56]mm · 9 of 160 slices shown (1 of 3)]
[im 1/160]
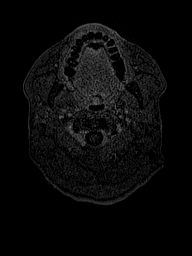
[im 20/160]
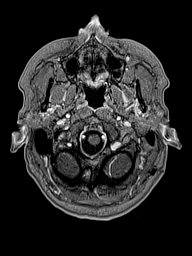
[im 40/160]
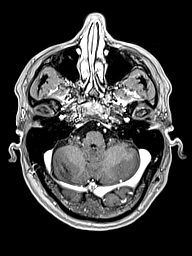
[im 60/160]
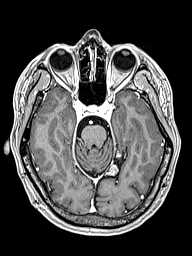
[im 80/160]
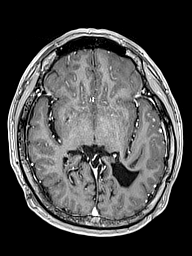
[im 100/160]
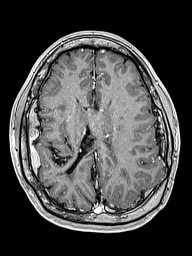
[im 120/160]
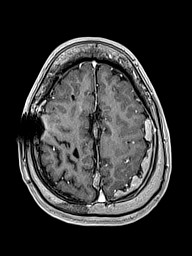
[im 140/160]
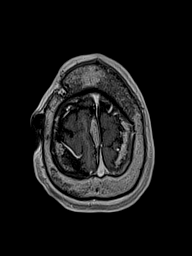
[im 160/160]
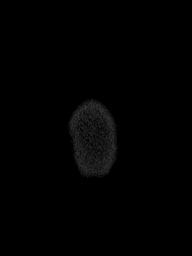

[Series 17: T1 post-contrast · coronal · 5.0mm · 0.43mm/px · 2 of 30 slices shown (2 of 3)]
[im 1/30]
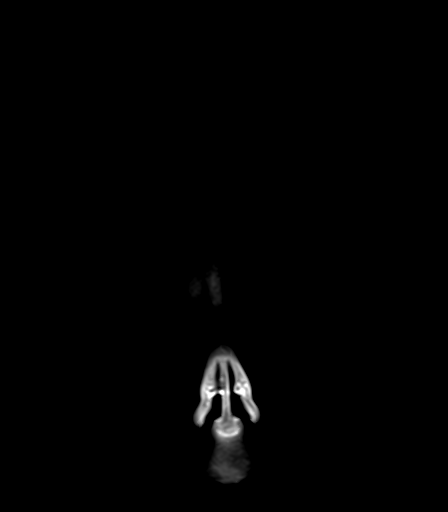
[im 30/30]
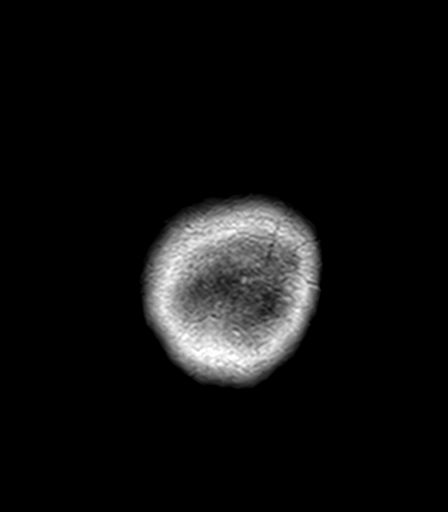

[Series 18: T1 post-contrast · sagittal · 5.0mm · 0.75mm/px · 2 of 26 slices shown (3 of 3)]
[im 1/26]
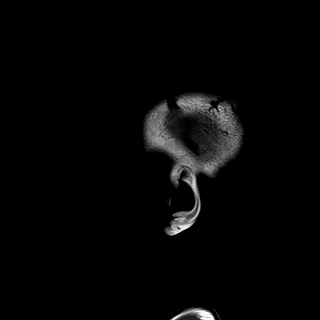
[im 26/26]
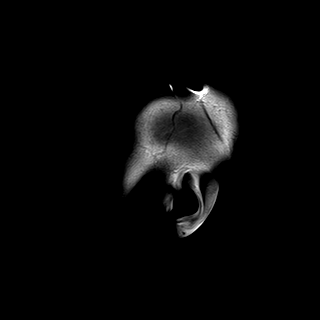

[48 of 48 positions shown; findings below may reference images not displayed]

FINDINGS: Susceptibility artifact from shunt apparatus on right.

Brain: There are two shunt catheters present. Right frontal approach
catheter terminates near the foramen of Uno. Left parietal
approach catheter traverses midline with tip near body of right
lateral ventricle. Ventricle caliber similar to the prior study.

No acute infarction or hemorrhage. Stable volume loss and
periventricular white matter abnormal signal likely reflecting
sequelae of remote white matter injury. Hypoplasia of the corpus
callosum. There is no intracranial mass or mass effect. Ossification
is noted along the falx and left tentorial leaflet.

No abnormal enhancement.

Vascular: Major vessel flow voids at the skull base are preserved.

Skull and upper cervical spine: There is hyperostosis along the
inner table of the calvarium. Normal marrow signal is preserved.

Sinuses/Orbits: Paranasal sinuses are aerated. Orbits are
unremarkable.

Other: Sella is unremarkable.  Mastoid air cells are clear.
IMPRESSION: No acute abnormality.

Shunt catheters are unchanged in position. Stable size of the of the
ventricles.

## 2023-12-15 ENCOUNTER — Encounter: Payer: Self-pay | Admitting: *Deleted

## 2023-12-19 ENCOUNTER — Ambulatory Visit: Attending: Physician Assistant | Admitting: Physician Assistant

## 2023-12-19 ENCOUNTER — Encounter: Payer: Self-pay | Admitting: Physician Assistant

## 2023-12-19 ENCOUNTER — Ambulatory Visit: Attending: Physician Assistant

## 2023-12-19 VITALS — BP 158/80 | HR 102 | Ht 62.0 in | Wt 172.6 lb

## 2023-12-19 DIAGNOSIS — R000 Tachycardia, unspecified: Secondary | ICD-10-CM | POA: Insufficient documentation

## 2023-12-19 DIAGNOSIS — R0602 Shortness of breath: Secondary | ICD-10-CM | POA: Insufficient documentation

## 2023-12-19 DIAGNOSIS — I10 Essential (primary) hypertension: Secondary | ICD-10-CM | POA: Insufficient documentation

## 2023-12-19 NOTE — Progress Notes (Signed)
 OFFICE NOTE:    Date:  12/19/2023  ID:  Sophia Marks, DOB Aug 02, 1980, MRN [id number] PCP: Katina Pfeiffer, PA-C  [facility] Providers Cardiologist:  None        Hypertension Cerebral palsy Hydrocephalus with VP shunt Hypothyroidism Depression Anxiety Cognitive impairment        Discussed the use of AI scribe software for clinical note transcription with the patient, who gave verbal consent to proceed. History of Present Illness Sophia Marks is a 43 y.o. female referred by Katina Pfeiffer, PA-C for shortness of breath.  She experiences fatigue and shortness of breath with exertion, first noted during a primary care visit in October 2025. At that time, she also had a cough and was started on azithromycin, an albuterol inhaler, and prednisone.   She has experienced progressive shortness of breath over the past year, impacting her ability to perform activities such as walking long distances, which she previously managed without difficulty. The dyspnea occurs with exertion, including walking from a parking lot to a building and performing household tasks like cleaning or taking out the trash. There is no associated chest pain during these activities, but she occasionally experiences sharp chest discomfort when bending over.  She was recently prescribed albuterol inhaler and did note some improvement in her symptoms when she used this.  Currently, she does not experience wheezing, cough, or other respiratory symptoms.  Socially, she occasionally consumes alcohol, does not smoke, and has never used drugs. 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  No family history of heart attack, heart failure or sudden cardiac death.  During the review of symptoms, she reports no fever, chills, cough, vomiting, diarrhea, or bloody stools. She also denies leg swelling, jaw pain, arm pain, or back pain associated with her shortness of breath. She uses two pillows to  sleep and sometimes experiences shortness of breath at night, though it does not consistently wake her.    ROS-See HPI    Studies Reviewed:  EKG Interpretation Date/Time:  Tuesday December 19 2023 13:24:25 EST Ventricular Rate:  102 PR Interval:  126 QRS Duration:  74 QT Interval:  360 QTC Calculation: 469 R Axis:   16  Text Interpretation: Sinus tachycardia Nonspecific ST and T wave abnormality Confirmed by Lelon Hamilton [phone]) on 12/19/2023 1:30:22 PM    LABS - From primary care personally reviewed and interpreted: Sed rate: 14 (11/24/2023) TSH: 3.23 (11/24/2023) Ferritin: 73 (11/24/2023) Hemoglobin: 14.9 (11/24/2023) Platelet count: 395,000 (11/24/2023) Creatinine: 0.74 (11/24/2023) eGFR: 102 (11/24/2023) Potassium: 4.1 (11/24/2023) ALT: 12 (11/24/2023)  HYPERTENSION CONTROL Vitals:   12/19/23 1317 12/19/23 1401  BP: (!) 132/98 (!) 158/80    The patient's blood pressure is elevated above target today.  In order to address the patient's elevated BP: Blood pressure will be monitored at home to determine if medication changes need to be made.         Physical Exam:  VS:  BP (!) 158/80   Pulse (!) 102   Ht 5' 2 (1.575 m)   Wt 172 lb 9.6 oz (78.3 kg)   SpO2 98%   BMI 31.57 kg/m        Wt Readings from Last 3 Encounters:  12/19/23 172 lb 9.6 oz (78.3 kg)  04/18/23 169 lb (76.7 kg)  11/26/21 138 lb 4.8 oz (62.7 kg)    Constitutional:      Appearance: Healthy appearance. Not in distress.  Neck:     Vascular: No JVR.  JVD normal.  Pulmonary:     Breath sounds: No wheezing. No rales.  Cardiovascular:     Tachycardia present. Regular rhythm.     Murmurs: There is no murmur.  Edema:    Peripheral edema absent.  Abdominal:     Palpations: Abdomen is soft.  Skin:    General: Skin is warm and dry.       Assessment and Plan:    Assessment & Plan Shortness of breath Shortness of breath with exertion for several months with activities like walking long  distances.  She has not had exertional chest pain.  She has noted some chest discomfort with position changes.  This does not sound cardiac. No leg swelling or orthopnea. Differential includes cardiac causes such as inappropriate sinus tachycardia, structural heart disease, ischemia and non-cardiac causes like lung issues. She notes a recent hx of weight gain, which may also be contributing. She has no significant risk factors for coronary artery disease other than HTN.  She is not having chest pain.  At this point, I do not think she needs ischemic testing.  I have suggested that she get a calcium score to see if her risk is elevated. - Order echocardiogram to r/o structural heart disease. - Ordered cardiac CT calcium score for risk stratification. - If CAC score is elevated, will need to consider stress testing vs CCTA - Obtain BNP today - Arrange two-week Zio XT heart monitor to evaluate heart rate as outlined below - Follow up 3 mos Essential hypertension Blood pressure elevated today. Currently on lisinopril 5 mg daily. Discussed potential need to adjust medication based on home blood pressure readings. Advised to monitor blood pressure at home. - Continue lisinopril 5 mg daily. - Monitor blood pressure at home  - Will increase lisinopril to 10 mg daily if home readings remain elevated. Sinus tachycardia Elevated heart rate noted. Recent TSH and hemoglobin normal. Question if she has inappropriate sinus tachycardia which could contribute to her shortness of breath.  - Arrange two-week heart monitor to assess heart rate patterns. - Will consider cardioselective beta blocker if heart rate remains elevated without other causes identified.         Dispo:  Return in about 3 months (around 03/20/2024) for Follow up after testing, w/ Glendia Ferrier, PA-C.  Signed, Glendia Ferrier, PA-C

## 2023-12-19 NOTE — Patient Instructions (Signed)
 Medication Instructions:  Your physician recommends that you continue on your current medications as directed. Please refer to the Current Medication list given to you today.  *If you need a refill on your cardiac medications before your next appointment, please call your pharmacy*  Lab Work: TODAY:  PRO BNP  If you have labs (blood work) drawn today and your tests are completely normal, you will receive your results only by: MyChart Message (if you have MyChart) OR A paper copy in the mail If you have any lab test that is abnormal or we need to change your treatment, we will call you to review the results.  Testing/Procedures: Your physician has requested that you have an echocardiogram. Echocardiography is a painless test that uses sound waves to create images of your heart. It provides your doctor with information about the size and shape of your heart and how well your heart's chambers and valves are working. This procedure takes approximately one hour. There are no restrictions for this procedure. Please do NOT wear cologne, perfume, aftershave, or lotions (deodorant is allowed). Please arrive 15 minutes prior to your appointment time.  Please note: We ask at that you not bring children with you during ultrasound (echo/ vascular) testing. Due to room size and safety concerns, children are not allowed in the ultrasound rooms during exams. Our front office staff cannot provide observation of children in our lobby area while testing is being conducted. An adult accompanying a patient to their appointment will only be allowed in the ultrasound room at the discretion of the ultrasound technician under special circumstances. We apologize for any inconvenience.   Your physician recommends you have a CT Calcium Score.  This is a self pay study, $99.00  ZIO XT- Long Term Monitor Instructions  Your physician has requested you wear a ZIO patch monitor for 14 days.  This is a single patch monitor.  [employer] supplies one patch monitor per enrollment. Additional stickers are not available. Please do not apply patch if you will be having a Nuclear Stress Test,  Echocardiogram, Cardiac CT, MRI, or Chest Xray during the period you would be wearing the  monitor. The patch cannot be worn during these tests. You cannot remove and re-apply the  ZIO XT patch monitor.  Your ZIO patch monitor will be mailed 3 day USPS to your address on file. It may take 3-5 days  to receive your monitor after you have been enrolled.  Once you have received your monitor, please review the enclosed instructions. Your monitor  has already been registered assigning a specific monitor serial # to you.  Billing and Patient Assistance Program Information  We have supplied [employer] with any of your insurance information on file for billing purposes. [employer] offers a sliding scale Patient Assistance Program for patients that do not have  insurance, or whose insurance does not completely cover the cost of the ZIO monitor.  You must apply for the Patient Assistance Program to qualify for this discounted rate.  To apply, please call [employer] at [phone], select option 4, select option 2, ask to apply for  Patient Assistance Program. Meredeth will ask your household income, and how many people  are in your household. They will quote your out-of-pocket cost based on that information.  [employer] will also be able to set up a 43-month, interest-free payment plan if needed.  Applying the monitor   Shave hair from upper left chest.  Hold abrader disc by orange tab. Rub abrader in 40  strokes over the upper left chest as  indicated in your monitor instructions.  Clean area with 4 enclosed alcohol pads. Let dry.  Apply patch as indicated in monitor instructions. Patch will be placed under collarbone on left  side of chest with arrow pointing upward.  Rub patch adhesive wings for 2 minutes. Remove white label marked 1. Remove the  white  label marked 2. Rub patch adhesive wings for 2 additional minutes.  While looking in a mirror, press and release button in center of patch. A small green light will  flash 3-4 times. This will be your only indicator that the monitor has been turned on.  Do not shower for the first 24 hours. You may shower after the first 24 hours.  Press the button if you feel a symptom. You will hear a small click. Record Date, Time and  Symptom in the Patient Logbook.  When you are ready to remove the patch, follow instructions on the last 2 pages of Patient  Logbook. Stick patch monitor onto the last page of Patient Logbook.  Place Patient Logbook in the blue and white box. Use locking tab on box and tape box closed  securely. The blue and white box has prepaid postage on it. Please place it in the mailbox as  soon as possible. Your physician should have your test results approximately 7 days after the  monitor has been mailed back to [facility].  Call [facility] Customer Care at [phone] if you have questions regarding  your ZIO XT patch monitor. Call them immediately if you see an orange light blinking on your  monitor.  If your monitor falls off in less than 4 days, contact our Monitor department at [phone].  If your monitor becomes loose or falls off after 4 days call [employer] at [phone] for  suggestions on securing your monitor   Follow-Up: At [facility], you and your health needs are our priority.  As part of our continuing mission to provide you with exceptional heart care, our providers are all part of one team.  This team includes your primary Cardiologist (physician) and Advanced Practice Providers or APPs (Physician Assistants and Nurse Practitioners) who all work together to provide you with the care you need, when you need it.  Your next appointment:   3 month(s)  Provider:   Glendia Ferrier, PA-C          We recommend signing up for the patient  portal called MyChart.  Sign up information is provided on this After Visit Summary.  MyChart is used to connect with patients for Virtual Visits (Telemedicine).  Patients are able to view lab/test results, encounter notes, upcoming appointments, etc.  Non-urgent messages can be sent to your provider as well.   To learn more about what you can do with MyChart, go to [email or web address].   Other Instructions Your physician has requested that you regularly monitor and record your blood pressure readings at home. Please use the same machine at the same time of day to check your readings and record them to bring to your follow-up visit.   Please monitor blood pressures and keep a log of your readings for 1 week and send them to me via mychart.    Make sure to check 2 hours after your medications.    AVOID these things for 30 minutes before checking your blood pressure: No Drinking caffeine. No Drinking alcohol. No Eating. No Smoking. No Exercising.   Five minutes before checking your  blood pressure: Pee. Sit in a dining chair. Avoid sitting in a soft couch or armchair. Be quiet. Do not talk

## 2023-12-19 NOTE — Progress Notes (Unsigned)
 Enrolled for [employer] to mail a ZIO XT long term holter monitor to the patients address on file.   DOD Dr. Jordan to read.

## 2023-12-20 ENCOUNTER — Ambulatory Visit: Payer: Self-pay | Admitting: Physician Assistant

## 2023-12-20 DIAGNOSIS — R931 Abnormal findings on diagnostic imaging of heart and coronary circulation: Secondary | ICD-10-CM

## 2023-12-20 LAB — PRO B NATRIURETIC PEPTIDE: NT-Pro BNP: 127 pg/mL (ref 0–130)

## 2023-12-22 ENCOUNTER — Other Ambulatory Visit: Payer: Self-pay | Admitting: Neurosurgery

## 2023-12-22 DIAGNOSIS — G919 Hydrocephalus, unspecified: Secondary | ICD-10-CM

## 2024-01-03 DIAGNOSIS — I10 Essential (primary) hypertension: Secondary | ICD-10-CM

## 2024-01-05 MED ORDER — LISINOPRIL 10 MG PO TABS: 10.0000 mg | ORAL_TABLET | Freq: Every day | ORAL | 3 refills | Status: AC

## 2024-01-16 DIAGNOSIS — R0602 Shortness of breath: Secondary | ICD-10-CM

## 2024-01-16 DIAGNOSIS — R000 Tachycardia, unspecified: Secondary | ICD-10-CM

## 2024-01-16 DIAGNOSIS — I10 Essential (primary) hypertension: Secondary | ICD-10-CM

## 2024-01-21 ENCOUNTER — Encounter: Payer: Self-pay | Admitting: Physician Assistant

## 2024-01-24 ENCOUNTER — Ambulatory Visit (HOSPITAL_COMMUNITY)

## 2024-01-30 ENCOUNTER — Other Ambulatory Visit (HOSPITAL_COMMUNITY)

## 2024-01-30 ENCOUNTER — Ambulatory Visit (HOSPITAL_COMMUNITY)

## 2024-03-06 ENCOUNTER — Ambulatory Visit (HOSPITAL_COMMUNITY)
Admission: RE | Admit: 2024-03-06 | Discharge: 2024-03-06 | Disposition: A | Source: Ambulatory Visit | Attending: Physician Assistant | Admitting: Physician Assistant

## 2024-03-06 ENCOUNTER — Ambulatory Visit (HOSPITAL_COMMUNITY): Payer: Self-pay

## 2024-03-06 DIAGNOSIS — I10 Essential (primary) hypertension: Secondary | ICD-10-CM

## 2024-03-06 DIAGNOSIS — R0600 Dyspnea, unspecified: Secondary | ICD-10-CM | POA: Diagnosis not present

## 2024-03-06 DIAGNOSIS — R000 Tachycardia, unspecified: Secondary | ICD-10-CM

## 2024-03-06 DIAGNOSIS — R0602 Shortness of breath: Secondary | ICD-10-CM

## 2024-03-06 LAB — ECHOCARDIOGRAM COMPLETE
Area-P 1/2: 6.04 cm2
S' Lateral: 2.6 cm

## 2024-03-06 MED ORDER — PERFLUTREN LIPID MICROSPHERE
1.0000 mL | INTRAVENOUS | Status: DC | PRN
  Administered 2024-03-06: 2 mL via INTRAVENOUS

## 2024-03-07 DIAGNOSIS — R000 Tachycardia, unspecified: Secondary | ICD-10-CM | POA: Insufficient documentation

## 2024-03-08 ENCOUNTER — Encounter: Payer: Self-pay | Admitting: Physician Assistant

## 2024-03-08 DIAGNOSIS — R931 Abnormal findings on diagnostic imaging of heart and coronary circulation: Secondary | ICD-10-CM | POA: Insufficient documentation

## 2024-03-19 ENCOUNTER — Ambulatory Visit: Admitting: Physician Assistant

## 2024-06-18 ENCOUNTER — Ambulatory Visit: Admitting: Physician Assistant
